# Patient Record
Sex: Female | Born: 1970 | Race: Asian | Hispanic: No | Marital: Married | Smoking: Current every day smoker
Health system: Southern US, Community
[De-identification: ages and names within clinical notes are randomized; demographics above are authoritative.]

## PROBLEM LIST (undated history)

## (undated) DIAGNOSIS — I10 Essential (primary) hypertension: Secondary | ICD-10-CM

## (undated) HISTORY — PX: TUBAL LIGATION: SHX77

---

## 1998-01-22 ENCOUNTER — Other Ambulatory Visit: Admission: RE | Admit: 1998-01-22 | Discharge: 1998-01-22 | Payer: Self-pay | Admitting: Obstetrics & Gynecology

## 2002-06-02 ENCOUNTER — Other Ambulatory Visit: Admission: RE | Admit: 2002-06-02 | Discharge: 2002-06-02 | Payer: Self-pay | Admitting: Obstetrics and Gynecology

## 2002-10-06 ENCOUNTER — Inpatient Hospital Stay (HOSPITAL_COMMUNITY): Admission: AD | Admit: 2002-10-06 | Discharge: 2002-10-06 | Payer: Self-pay | Admitting: Obstetrics and Gynecology

## 2002-10-20 ENCOUNTER — Inpatient Hospital Stay (HOSPITAL_COMMUNITY): Admission: AD | Admit: 2002-10-20 | Discharge: 2002-10-20 | Payer: Self-pay | Admitting: Obstetrics and Gynecology

## 2004-07-08 ENCOUNTER — Other Ambulatory Visit: Admission: RE | Admit: 2004-07-08 | Discharge: 2004-07-08 | Payer: Self-pay | Admitting: Obstetrics & Gynecology

## 2005-07-15 ENCOUNTER — Other Ambulatory Visit: Admission: RE | Admit: 2005-07-15 | Discharge: 2005-07-15 | Payer: Self-pay | Admitting: Obstetrics & Gynecology

## 2006-08-24 ENCOUNTER — Emergency Department (HOSPITAL_COMMUNITY): Admission: EM | Admit: 2006-08-24 | Discharge: 2006-08-24 | Payer: Self-pay | Admitting: Family Medicine

## 2010-07-18 ENCOUNTER — Ambulatory Visit (HOSPITAL_COMMUNITY): Admission: RE | Admit: 2010-07-18 | Discharge: 2010-07-18 | Payer: Self-pay | Admitting: Specialist

## 2012-07-10 ENCOUNTER — Ambulatory Visit: Payer: Self-pay | Admitting: Family Medicine

## 2012-07-10 VITALS — BP 152/90 | HR 82 | Temp 98.6°F | Resp 16 | Ht 63.0 in | Wt 127.0 lb

## 2012-07-10 DIAGNOSIS — Z720 Tobacco use: Secondary | ICD-10-CM

## 2012-07-10 DIAGNOSIS — F17200 Nicotine dependence, unspecified, uncomplicated: Secondary | ICD-10-CM

## 2012-07-10 MED ORDER — BUPROPION HCL ER (SR) 150 MG PO TB12: ORAL_TABLET | ORAL | Status: DC

## 2012-07-10 NOTE — Patient Instructions (Signed)
Start wellbutrin (zyban) - once per day for 3 days, then twice per day.  Stop smoking 5-7 days after starting medicine.  Return to the clinic or go to the nearest emergency room if any of your symptoms worsen or new symptoms occur.

## 2012-07-10 NOTE — Progress Notes (Signed)
  Subjective:    Patient ID: Vicki Jordan, female    DOB: Apr 30, 1971, 41 y.o.   MRN: [id number]  HPI Vicki Jordan is a 40 y.o. female  Here to discuss options for stopping smoking.  1/2 ppd smoker currently. Quit once in 2001 - restarted in 2008, 1.2ppd smoker since then.    Had been seen here in 2011 - rx given for medicine - but did not use it. No hx of depression/anxiety/SI or other mental illness. Had good relief in 2001 when she took zyban  - would like to use this again.   Review of Systems     Objective:   Physical Exam  Constitutional: She is oriented to person, place, and time. She appears well-developed and well-nourished.  HENT:  Head: Normocephalic and atraumatic.  Cardiovascular: Normal rate, regular rhythm, normal heart sounds and intact distal pulses.   Pulmonary/Chest: Effort normal and breath sounds normal.  Neurological: She is alert and oriented to person, place, and time.  Skin: Skin is warm and dry.  Psychiatric: She has a normal mood and affect. Her behavior is normal.       Assessment & Plan:  Vicki Jordan is a 41 y.o. female 1. Tobacco abuse  buPROPion (WELLBUTRIN SR) 150 MG 12 hr tablet   Options discussed.  Can retry zyban.  Consider chantix if not effective.  Patient Instructions  Start wellbutrin (zyban) - once per day for 3 days, then twice per day.  Stop smoking 5-7 days after starting medicine.  Return to the clinic or go to the nearest emergency room if any of your symptoms worsen or new symptoms occur.

## 2012-07-16 ENCOUNTER — Ambulatory Visit: Payer: Self-pay | Admitting: Family Medicine

## 2012-07-16 ENCOUNTER — Emergency Department (HOSPITAL_COMMUNITY): Payer: Self-pay

## 2012-07-16 ENCOUNTER — Encounter (HOSPITAL_COMMUNITY): Payer: Self-pay | Admitting: Emergency Medicine

## 2012-07-16 ENCOUNTER — Emergency Department (HOSPITAL_COMMUNITY)
Admission: EM | Admit: 2012-07-16 | Discharge: 2012-07-16 | Disposition: A | Discharge status: Home / Self Care | Payer: Self-pay | Attending: Emergency Medicine | Admitting: Emergency Medicine

## 2012-07-16 VITALS — BP 182/110 | HR 97 | Temp 98.2°F | Resp 16 | Ht 61.25 in | Wt 124.8 lb

## 2012-07-16 DIAGNOSIS — R51 Headache: Secondary | ICD-10-CM | POA: Insufficient documentation

## 2012-07-16 DIAGNOSIS — R209 Unspecified disturbances of skin sensation: Secondary | ICD-10-CM

## 2012-07-16 DIAGNOSIS — I10 Essential (primary) hypertension: Secondary | ICD-10-CM

## 2012-07-16 DIAGNOSIS — R29898 Other symptoms and signs involving the musculoskeletal system: Secondary | ICD-10-CM

## 2012-07-16 DIAGNOSIS — D649 Anemia, unspecified: Secondary | ICD-10-CM

## 2012-07-16 DIAGNOSIS — R202 Paresthesia of skin: Secondary | ICD-10-CM

## 2012-07-16 DIAGNOSIS — H538 Other visual disturbances: Secondary | ICD-10-CM | POA: Insufficient documentation

## 2012-07-16 DIAGNOSIS — A599 Trichomoniasis, unspecified: Secondary | ICD-10-CM | POA: Insufficient documentation

## 2012-07-16 HISTORY — DX: Essential (primary) hypertension: I10

## 2012-07-16 LAB — CBC WITH DIFFERENTIAL/PLATELET
Basophils Relative: 2 % — ABNORMAL HIGH (ref 0–1)
Eosinophils Absolute: 0 10*3/uL (ref 0.0–0.7)
HCT: 31.5 % — ABNORMAL LOW (ref 36.0–46.0)
Hemoglobin: 9.1 g/dL — ABNORMAL LOW (ref 12.0–15.0)
MCH: 17.8 pg — ABNORMAL LOW (ref 26.0–34.0)
MCHC: 28.9 g/dL — ABNORMAL LOW (ref 30.0–36.0)
MCV: 61.8 fL — ABNORMAL LOW (ref 78.0–100.0)
Monocytes Absolute: 0.3 10*3/uL (ref 0.1–1.0)
Monocytes Relative: 6 % (ref 3–12)

## 2012-07-16 LAB — COMPREHENSIVE METABOLIC PANEL
Albumin: 3.9 g/dL (ref 3.5–5.2)
BUN: 8 mg/dL (ref 6–23)
Chloride: 98 mEq/L (ref 96–112)
Creatinine, Ser: 0.65 mg/dL (ref 0.50–1.10)
Total Bilirubin: 0.9 mg/dL (ref 0.3–1.2)
Total Protein: 7.4 g/dL (ref 6.0–8.3)

## 2012-07-16 LAB — POCT CBC
HCT, POC: 33.6 % — AB (ref 37.7–47.9)
Hemoglobin: 9.6 g/dL — AB (ref 12.2–16.2)
Lymph, poc: 1.7 (ref 0.6–3.4)
MCH, POC: 17.7 pg — AB (ref 27–31.2)
MCHC: 28.6 g/dL — AB (ref 31.8–35.4)
WBC: 6.3 10*3/uL (ref 4.6–10.2)

## 2012-07-16 LAB — URINE MICROSCOPIC-ADD ON

## 2012-07-16 LAB — URINALYSIS, ROUTINE W REFLEX MICROSCOPIC
Glucose, UA: NEGATIVE mg/dL
Protein, ur: NEGATIVE mg/dL

## 2012-07-16 LAB — POCT PREGNANCY, URINE: Preg Test, Ur: NEGATIVE

## 2012-07-16 MED ORDER — DOXYCYCLINE HYCLATE 100 MG PO CAPS: 100.0000 mg | ORAL_CAPSULE | Freq: Two times a day (BID) | ORAL | Status: DC

## 2012-07-16 MED ORDER — AZITHROMYCIN 250 MG PO TABS
1000.0000 mg | ORAL_TABLET | Freq: Once | ORAL | Status: AC
  Administered 2012-07-16: 1000 mg via ORAL
  Filled 2012-07-16: qty 4

## 2012-07-16 NOTE — ED Notes (Signed)
Pt describes episode of blurred vision last noc around 2000, left arm weakness and numbness onset yesterday morning. Checked BP this a.m. And went to urgent care. Sent here from urgent care, BP there 182/110

## 2012-07-16 NOTE — ED Notes (Signed)
I placed her personal belongings into a bag along with her purse, shoes, jeans and a bra

## 2012-07-16 NOTE — Patient Instructions (Addendum)
Your blood pressure is very high today, and even though no weakness was noted on my exam, your arm numbness and weakness since yesterday, along with this high blood pressure needs to be evaluated in the emergency room.  Your daughter should drive you to [facility] ER now.  I have advised their staff that you are on the way.  Your blood count was low today (anemia) but only a little lower than in the past.  Recheck in the next 2 weeks to discuss causes for this and  workup for treatment. Return to the clinic or go to the nearest emergency room if any of your symptoms worsen or new symptoms occur.

## 2012-07-16 NOTE — ED Provider Notes (Signed)
History     CSN: [id number]  Arrival date & time 07/16/12  1320   First MD Initiated Contact with Patient 07/16/12 1503      Chief Complaint  Patient presents with  . Blurred Vision  . Numbness    HPI  The patient presents with concerns of headache, hypertension, visual changes.  She was previously on blood pressure medication, but has not taken an anion long time.  She notes approximately one week ago, while at urgent care for an unrelated complaint she described headache, her blood pressure checked, found to be elevated.  She notes that since that time she has had headache, diffuse, throbbing.  She received a single dose of antihypertensive at that time, and her blood pressure responded appropriately. Yesterday the patient had multiple episodes of visual loss transiently while ambulatory.  She denies any precipitant other than walking.  Symptoms resolved spontaneously, after moments.  She notes no concurrent increase in her headache. Today she has no current visual complaints, but continues to complain of headache. The patient also notes left arm pain, though this is focally in the areolar she has been taking her blood pressure multiple times, the left a.c. area.  The pain is sharp, occurring after innumerable blood pressure measurements taken today.  Past Medical History  Diagnosis Date  . Hypertension     Past Surgical History  Procedure Date  . Tubal ligation     No family history on file.  History  Substance Use Topics  . Smoking status: Current Every Day Smoker -- .5 years    Types: Cigarettes  . Smokeless tobacco: Never Used  . Alcohol Use: No    OB History    Grav Para Term Preterm Abortions TAB SAB Ect Mult Living                  Review of Systems  Constitutional:       HPI  HENT:       HPI otherwise negative  Eyes: Negative.   Respiratory:       HPI, otherwise negative  Cardiovascular:       HPI, otherwise nmegative  Gastrointestinal: Negative for  vomiting.  Genitourinary:       HPI, otherwise negative  Musculoskeletal:       HPI, otherwise negative  Skin: Negative.   Neurological: Positive for headaches. Negative for dizziness, seizures, syncope, facial asymmetry, weakness, light-headedness and numbness.    Allergies  Review of patient's allergies indicates no known allergies.  Home Medications   Current Outpatient Rx  Name Route Sig Dispense Refill  . BUPROPION HCL ER (SR) 150 MG PO TB12 Oral Take 150 mg by mouth 2 (two) times daily.    Doreatha Martin COLD & FLU PO Oral Take 5 mLs by mouth 2 (two) times daily as needed. For cold symptoms    . NAPROXEN SODIUM 220 MG PO TABS Oral Take 440 mg by mouth 2 (two) times daily as needed. For headache pain      BP 133/82  Pulse 69  Temp 98.2 F (36.8 C) (Oral)  Resp 25  SpO2 99%  LMP 07/09/2012  Physical Exam  Nursing note and vitals reviewed. Constitutional: She is oriented to person, place, and time. She appears well-developed and well-nourished. No distress.  HENT:  Head: Normocephalic and atraumatic.  Eyes: Conjunctivae normal and EOM are normal. Pupils are equal, round, and reactive to light.  Cardiovascular: Normal rate and regular rhythm.   Pulmonary/Chest: Effort normal and breath  sounds normal. No stridor. No respiratory distress.  Abdominal: She exhibits no distension.  Musculoskeletal: She exhibits no edema.  Neurological: She is alert and oriented to person, place, and time. No cranial nerve deficit. She exhibits normal muscle tone. Coordination normal.  Skin: Skin is warm and dry.  Psychiatric: She has a normal mood and affect.    ED Course  Procedures (including critical care time)   Labs Reviewed  CBC WITH DIFFERENTIAL  COMPREHENSIVE METABOLIC PANEL  URINALYSIS, ROUTINE W REFLEX MICROSCOPIC   Dg Chest 2 View  07/16/2012  *RADIOLOGY REPORT*  Clinical Data:  Discomfort, finger tingling, blurred vision  CHEST - 2 VIEW  Comparison: None.  Findings: Lungs are  well-aerated.  Negative for pulmonary edema, or focal airspace consolidation.  Diffuse mild prominence of the interstitial markings.  No prior imaging for comparison.  Cardiac and mediastinal contours within normal limits for size. No acute osseous finding.  IMPRESSION:  1.  No acute cardiopulmonary disease. 2.  Mild diffuse coarsening of the interstitial markings is likely chronic and related to the patient's smoking history.   Original Report Authenticated By: Threasa Beards Head Wo Contrast  07/16/2012  *RADIOLOGY REPORT*  Clinical Data: Headache for 5-6 days.  CT HEAD WITHOUT CONTRAST  Technique:  Contiguous axial images were obtained from the base of the skull through the vertex without contrast.  Comparison: None.  Findings: The brain appears normal without evidence of infarct, hemorrhage, mass lesion, mass effect, midline shift or abnormal extra-axial fluid collection.  No hydrocephalus or pneumocephalus. Calvarium intact.  Imaged paranasal sinuses and mastoid air cells are clear.  IMPRESSION: Normal study.   Original Report Authenticated By: Bernadene Bell. Maricela Curet, M.D.      No diagnosis found.  Cardiac: 70sr, normal O2: 99%ra, normal   Date: 07/16/2012  Rate: 69  Rhythm: normal sinus rhythm  QRS Axis: normal  Intervals: normal  ST/T Wave abnormalities: normal  Conduction Disutrbances: none  Narrative Interpretation: unremarkable (much artefact)    UPDATE: patient remains calm  MDM  This young female presents with concerns of hypertension, intermittent visual issues, and an episode of headache.  On exam she is in no distress with no focal neurologic.  The patient is hypertensive on presentation her blood pressure improves spontaneously.  Labs are reassuring, though the patient's trichomonas.  No evidence of endorgan effects.  The patient ABCDs to score is 1.  We discussed the need for close outpatient followup.  The patient was started on antibiotics for her infection, discharged in  stable condition.  Gerhard Munch, MD 07/16/12 1715

## 2012-07-16 NOTE — Progress Notes (Signed)
Subjective:    Patient ID: Vicki Jordan, female    DOB: [birth year], 41 y.o.   MRN: [id number]  HPI Vicki Jordan is a 41 y.o. female  Having headache for past 5-6 days.  Hx of high blood pressure meds few years ago - unknown name. Usually has ha and blurry vision if pressure runs high.  No current blurry vision.  No chest pain.  both arms feel weak, and some numbness  into fingers.  No prior similar symptoms.  No hx of stroke or TIA, No hx of MI. No leg weakness, no slurred speech or facial weakness. Headache all over. No dizziness/lightheadeness, nausea, or dyspnea.  Both hands with numbness into fingertips.  Tobacco abuse - had been trying to quit this week, on wellbutrin until this morning - had headache - so stopped wellbutrin 2 days ago.  No illicit drug use.  Did take nyquil and alleve yesterday.    Review of Systems  Constitutional: Negative for fever and chills.  Respiratory: Negative for chest tightness and shortness of breath.   Cardiovascular: Negative for chest pain and palpitations.  Neurological: Positive for weakness, numbness (hands.left greater than right. ) and headaches. Negative for dizziness and light-headedness.   As above.     Objective:   Physical Exam  Constitutional: She is oriented to person, place, and time. She appears well-developed and well-nourished.  HENT:  Head: Normocephalic and atraumatic.  Eyes: Conjunctivae normal and EOM are normal. Pupils are equal, round, and reactive to light.  Neck: Carotid bruit is not present.  Cardiovascular: Normal rate, regular rhythm, normal heart sounds and intact distal pulses.   Pulmonary/Chest: Effort normal and breath sounds normal.  Abdominal: Soft. She exhibits no pulsatile midline mass. There is no tenderness.  Musculoskeletal:       Right elbow: Normal.      Left elbow: Normal.       Right wrist: Normal.       Left wrist: Normal.       Right hand: Normal strength noted.       Left hand: Normal strength  noted.  Neurological: She is alert and oriented to person, place, and time.  Skin: Skin is warm and dry.  Psychiatric: She has a normal mood and affect. Her behavior is normal.    EKG: sr, no apparent acute findings.  Results for orders placed in visit on 07/16/12  POCT CBC      Component Value Range   WBC 6.3  4.6 - 10.2 K/uL   Lymph, poc 1.7  0.6 - 3.4   POC LYMPH PERCENT 27.4  10 - 50 %L   MID (cbc) 0.4  0 - 0.9   POC MID % 6.0  0 - 12 %M   POC Granulocyte 4.2  2 - 6.9   Granulocyte percent 66.6  37 - 80 %G   RBC 5.43  4.04 - 5.48 M/uL   Hemoglobin 9.6 (*) 12.2 - 16.2 g/dL   HCT, POC 40.9 (*) 81.1 - 47.9 %   MCV 61.9 (*) 80 - 97 fL   MCH, POC 17.7 (*) 27 - 31.2 pg   MCHC 28.6 (*) 31.8 - 35.4 g/dL   RDW, POC 91.4     Platelet Count, POC 487 (*) 142 - 424 K/uL   MPV 0  0 - 99.8 fL  GLUCOSE, POCT (MANUAL RESULT ENTRY)      Component Value Range   POC Glucose 98  70 - 99 mg/dl   7829: less  numbness in arms. Feels better, but still there.  Still c/o headache.   BP recheck noted a 178/120.  Manual BP R arm: 190/110, L arm 188/102.   Clonidine 0.1mg  po at 1120. Recheck BP at 1128: 198/116 L arm, 190/112 on R arm.  BP168/102 R arm at 1145.       Assessment & Plan:  Vicki Jordan is a 41 y.o. female 1. Headache  POCT CBC, POCT glucose (manual entry), EKG 12-Lead  2. HTN (hypertension)  POCT CBC, POCT glucose (manual entry), EKG 12-Lead  3. Paresthesia of hand  POCT CBC, POCT glucose (manual entry), EKG 12-Lead  4. Weakness of both arms    5. Anemia     HTN - uncontrolled, with hypertensive urgency - subjective symptoms of left greater than R arm weakness and dysethesias/paprasthesias since yesterday.  No focal weakness on exam noted, and slight improvement in symptoms in office, but dysesthesias persistent.Minimal change with clonidine 0.1mg . Given in office.  Will have evaluated in ER - daughter to drive patient there today. 911 precautions discussed as private vehicle  transport. ER charge nurse advised at [facility].   Anemia - HGB 9.7.  Last recorded hgb here 10.5 in may 2005.  Recommended follow up after hospital eval for HTN.   Tobacco abuse - unlikely Wellbutrin as cause of headache, but can discuss cessation options at follow up after HTN evaluated/treated -   Patient Instructions  Your blood pressure is very high today, and even though no weakness was noted on my exam, your arm numbness and weakness since yesterday, along with this high blood pressure needs to be evaluated in the emergency room.  Your daughter should drive you to [facility] ER now.  I have advised their staff that you are on the way.  Your blood count was low today (anemia) but only a little lower than in the past.  Recheck in the next 2 weeks to discuss causes for this and  workup for treatment. Return to the clinic or go to the nearest emergency room if any of your symptoms worsen or new symptoms occur.

## 2012-07-17 ENCOUNTER — Ambulatory Visit: Payer: Self-pay | Admitting: Family Medicine

## 2012-07-17 VITALS — BP 148/92 | HR 92 | Temp 98.1°F | Resp 16 | Ht 61.25 in | Wt 125.0 lb

## 2012-07-17 DIAGNOSIS — R8281 Pyuria: Secondary | ICD-10-CM

## 2012-07-17 DIAGNOSIS — D649 Anemia, unspecified: Secondary | ICD-10-CM

## 2012-07-17 DIAGNOSIS — R82998 Other abnormal findings in urine: Secondary | ICD-10-CM

## 2012-07-17 DIAGNOSIS — I10 Essential (primary) hypertension: Secondary | ICD-10-CM

## 2012-07-17 DIAGNOSIS — A599 Trichomoniasis, unspecified: Secondary | ICD-10-CM

## 2012-07-17 LAB — POCT CBC
Granulocyte percent: 65.2 %G (ref 37–80)
MCHC: 28.4 g/dL — AB (ref 31.8–35.4)
MID (cbc): 0.4 (ref 0–0.9)
POC Granulocyte: 4.3 (ref 2–6.9)
POC LYMPH PERCENT: 28.3 %L (ref 10–50)
Platelet Count, POC: 491 10*3/uL — AB (ref 142–424)
RDW, POC: 20.9 %

## 2012-07-17 LAB — POCT URINALYSIS DIPSTICK
Glucose, UA: NEGATIVE
Spec Grav, UA: 1.015

## 2012-07-17 LAB — IFOBT (OCCULT BLOOD): IFOBT: NEGATIVE

## 2012-07-17 LAB — POCT UA - MICROSCOPIC ONLY: Mucus, UA: NEGATIVE

## 2012-07-17 MED ORDER — METRONIDAZOLE 500 MG PO TABS: ORAL_TABLET | ORAL | Status: DC

## 2012-07-17 MED ORDER — HYDROCHLOROTHIAZIDE 12.5 MG PO TABS: 12.5000 mg | ORAL_TABLET | Freq: Every day | ORAL | Status: DC

## 2012-07-17 MED ORDER — FERROUS SULFATE 325 (65 FE) MG PO TABS: 325.0000 mg | ORAL_TABLET | Freq: Every day | ORAL | Status: DC

## 2012-07-17 NOTE — Progress Notes (Signed)
Subjective:    Patient ID: Vicki Jordan, female    DOB: 1971/08/18, 41 y.o.   MRN: [id number]  HPI Vicki Jordan is a 41 y.o. female  See OV yesterday.  Sent to ER due to high blood pressure.  Monitored there,  Had cxr and ct scan - no acute findings.  Feeling better today.  Still tired and some headache - minimal. Not started on any blood pressure medicine in the ER. No arm weakness - just sore from bp cuff yesterday. Home bp this am: 140/88. Most recent rx hctz in 2011.  Lisinopril in past.   In ER had urine test - 21-50 WBC, but many epithelial cells,  and trichomonas noted. No dysuria, frequency, urgency, or abd pain.  Has had some vaginal discharge - longstanding. Last sexually active 3 years ago. Had azithromycin 1000g po x 1 yesterday, then rx for doxycycline - but has not had filled.  Anemia - HGB 9.7 Yesterday in office, 9.1 in the ER .  Last recorded hgb here 10.5 in may 2005. Menses 4-5 days.  Heavy bleeding, but this is chronic - no recent change. LMP - last week, normal.  No blood in stools, or dark/tarry stools.   Review of Systems  Constitutional: Negative for fever and chills.  Respiratory: Negative for cough and chest tightness.   Cardiovascular: Negative for chest pain.  Gastrointestinal: Negative for abdominal pain.  Genitourinary: Positive for vaginal discharge. Negative for dysuria, urgency, frequency, hematuria, vaginal bleeding and vaginal pain.       Objective:   Physical Exam  Constitutional: She is oriented to person, place, and time. She appears well-developed and well-nourished.  HENT:  Head: Normocephalic and atraumatic.  Eyes: Conjunctivae normal and EOM are normal. Pupils are equal, round, and reactive to light.  Neck: Carotid bruit is not present.  Cardiovascular: Normal rate, regular rhythm, normal heart sounds and intact distal pulses.   Pulmonary/Chest: Effort normal and breath sounds normal.  Abdominal: Soft. She exhibits no pulsatile midline mass.  There is no tenderness.  Genitourinary:       hemosure obtained - exam performed by Dr. Nilda Simmer.  No apparent mass. Small skin tag.     Neurological: She is alert and oriented to person, place, and time.  Skin: Skin is warm and dry.  Psychiatric: She has a normal mood and affect. Her behavior is normal.   hemosure obtained - exam performed by Dr. Nilda Simmer.  No apparent mass. Small skin tag.   Results for orders placed in visit on 07/17/12  POCT URINALYSIS DIPSTICK      Component Value Range   Color, UA yellow     Clarity, UA cloudy     Glucose, UA neg     Bilirubin, UA small     Ketones, UA 15mg      Spec Grav, UA 1.015     Blood, UA neg     pH, UA 7.0     Protein, UA 30mg      Urobilinogen, UA 1.0     Nitrite, UA neg     Leukocytes, UA large (3+)    POCT UA - MICROSCOPIC ONLY      Component Value Range   WBC, Ur, HPF, POC tntc     RBC, urine, microscopic 8-12     Bacteria, U Microscopic trace     Mucus, UA neg     Epithelial cells, urine per micros 5-7     Crystals, Ur, HPF, POC calcium oxalate  Casts, Ur, LPF, POC neg     Yeast, UA neg     Trichomonas, UA positive    POCT CBC      Component Value Range   WBC 6.6  4.6 - 10.2 K/uL   Lymph, poc 1.9  0.6 - 3.4   POC LYMPH PERCENT 28.3  10 - 50 %L   MID (cbc) 0.4  0 - 0.9   POC MID % 6.5  0 - 12 %M   POC Granulocyte 4.3  2 - 6.9   Granulocyte percent 65.2  37 - 80 %G   RBC 5.55 (*) 4.04 - 5.48 M/uL   Hemoglobin 9.9 (*) 12.2 - 16.2 g/dL   HCT, POC 16.1 (*) 09.6 - 47.9 %   MCV 62.9 (*) 80 - 97 fL   MCH, POC 17.8 (*) 27 - 31.2 pg   MCHC 28.4 (*) 31.8 - 35.4 g/dL   RDW, POC 04.5     Platelet Count, POC 491 (*) 142 - 424 K/uL   MPV    0 - 99.8 fL  IFOBT (OCCULT BLOOD)      Component Value Range   IFOBT Negative         Assessment & Plan:  Vicki Jordan is a 41 y.o. female 1. HTN (hypertension)    2. Anemia  POCT CBC, IFOBT POC (occult bld, rslt in office)  3. Pyuria  POCT urinalysis dipstick, POCT UA  - Microscopic Only, Urine culture  4. Trichomonosis     HTN - improved today, but prior HTN urgency.  Hctz 12.5mg  qd.  Check outside numbers and recheck in next 2 weeks.   Anemia -  Chronic.  hemosure negative. Hx of heavy menses. Start iron 325mg  qd and recheck levels in few weeks.  Trichomonas - flagyl 500mg  - 4x1. Sed.  Denies recent partners to be treated.   Pyuria - asx, with non-clean catch u/a yesterday. Still tntc wbc, but trichomonas also noted as above may be contributor.  Urine culture sent.   Borderline Na - 132 yesterday. Recheck in few weeks at recheck.   Patient Instructions  Start the new blood pressure medicine - once per day. Keep a record of your blood pressures outside of the office and bring them to the next office visit. Take all 4 metronidazole today for trichomonas infection - see the handout form the ER.  You do not need to take the medicine prescribed in the emergency room. You are anemic (low blood count) - start iron pill once per day. Recheck in 2 weeks - sooner if any worse. Your should receive a call or letter about your lab results within the next week to 10 days.  Return to the clinic or go to the nearest emergency room if any of your symptoms worsen or new symptoms occur.

## 2012-07-17 NOTE — Patient Instructions (Addendum)
Start the new blood pressure medicine - once per day. Keep a record of your blood pressures outside of the office and bring them to the next office visit. Take all 4 metronidazole today for trichomonas infection - see the handout form the ER.  You do not need to take the medicine prescribed in the emergency room. You are anemic (low blood count) - start iron pill once per day. Recheck in 2 weeks - sooner if any worse. Your should receive a call or letter about your lab results within the next week to 10 days.  Return to the clinic or go to the nearest emergency room if any of your symptoms worsen or new symptoms occur.

## 2012-07-19 LAB — URINE CULTURE: Colony Count: NO GROWTH

## 2012-09-17 ENCOUNTER — Ambulatory Visit: Payer: Self-pay | Admitting: Family Medicine

## 2012-09-17 VITALS — BP 158/93 | HR 72 | Temp 98.2°F | Resp 18 | Ht 61.25 in | Wt 133.0 lb

## 2012-09-17 DIAGNOSIS — I10 Essential (primary) hypertension: Secondary | ICD-10-CM

## 2012-09-17 MED ORDER — LISINOPRIL 10 MG PO TABS: 10.0000 mg | ORAL_TABLET | Freq: Every day | ORAL | Status: DC

## 2012-09-17 NOTE — Progress Notes (Signed)
@[email or web address] @  Patient ID: Vicki Jordan MRN: [id number], DOB: [birth year], 41 y.o. Date of Encounter: 09/17/2012, 6:18 PM  Primary Physician: Tally Due, MD  Chief Complaint: HTN  HPI: 41 y.o. year old female with history below presents for hypertension follow up.  Patient did well on Lisinopril but has not kept blood pressure controlled on HCTZ.  She has had a headache x 24 hours. She quit cigarettes one month ago She is exercising regularly/  No CP, visual changes, or focal deficits.   Past Medical History  Diagnosis Date  . Hypertension      Home Meds: Prior to Admission medications   Medication Sig Start Date End Date Taking? Authorizing Provider  ferrous sulfate 325 (65 FE) MG tablet Take 1 tablet (325 mg total) by mouth daily with breakfast. 07/17/12  Yes Shade Flood, MD  hydrochlorothiazide (HYDRODIURIL) 12.5 MG tablet Take 1 tablet (12.5 mg total) by mouth daily. 07/17/12  Yes Shade Flood, MD  buPROPion [facility] SR) 150 MG 12 hr tablet Take 150 mg by mouth 2 (two) times daily.    Historical Provider, MD  DM-Doxylamine-Acetaminophen (NYQUIL COLD & FLU PO) Take 5 mLs by mouth 2 (two) times daily as needed. For cold symptoms    Historical Provider, MD  metroNIDAZOLE (FLAGYL) 500 MG tablet 4 pills by mouth all at once. Avoid any alcohol while taking this medicine. 07/17/12   Shade Flood, MD  naproxen sodium (ALEVE) 220 MG tablet Take 440 mg by mouth 2 (two) times daily as needed. For headache pain    Historical Provider, MD    Allergies: No Known Allergies  History   Social History  . Marital Status: Single    Spouse Name: N/A    Number of Children: N/A  . Years of Education: N/A   Occupational History  . Not on file.   Social History Main Topics  . Smoking status: Former Smoker -- .5 years    Types: Cigarettes    Quit date: 08/13/2012  . Smokeless tobacco: Never Used  . Alcohol Use: No  . Drug Use: No  . Sexually Active: Not on file    Other Topics Concern  . Not on file   Social History Narrative  . No narrative on file     No family history on file.  Review of Systems: Constitutional: negative for chills, fever, night sweats, weight changes, or fatigue  HEENT: negative for vision changes, hearing loss, congestion, rhinorrhea, ST, epistaxis, or sinus pressure Cardiovascular: negative for chest pain, palpitations, or DOE Respiratory: negative for hemoptysis, wheezing, shortness of breath, or cough Abdominal: negative for abdominal pain, nausea, vomiting, diarrhea, or constipation Dermatological: negative for rash Neurologic: negative for headache, dizziness, or syncope All other systems reviewed and are otherwise negative with the exception to those above and in the HPI.   Physical Exam: Blood pressure 158/93, pulse 72, temperature 98.2 F (36.8 C), temperature source Oral, resp. rate 18, height 5' 1.25" (1.556 m), weight 133 lb (60.328 kg), last menstrual period 09/07/2012, SpO2 100.00%., Body mass index is 24.93 kg/(m^2). General: Well developed, well nourished, in no acute distress. Head: Normocephalic, atraumatic, eyes without discharge, sclera non-icteric, nares are without discharge. Bilateral auditory canals clear, TM's are without perforation, pearly grey and translucent with reflective cone of light bilaterally. Oral cavity moist, posterior pharynx without exudate, erythema, peritonsillar abscess, or post nasal drip.  Neck: Supple. No thyromegaly. Full ROM. No lymphadenopathy. No carotid bruits. Lungs: Clear bilaterally to auscultation without wheezes, rales,  or rhonchi. Breathing is unlabored. Heart: RRR with S1 S2. No murmurs, rubs, or gallops appreciated.  Abdomen: Soft, non-tender, non-distended with normoactive bowel sounds. No hepatosplenomegaly. No rebound/guarding. No obvious abdominal masses. Msk:  Strength and tone normal for age. Extremities/Skin: Warm and dry. No clubbing or cyanosis. No  edema. No rashes or suspicious lesions. Distal pulses 2+ and equal bilaterally. Neuro: Alert and oriented X 3. Moves all extremities spontaneously. Gait is normal. CNII-XII grossly in tact. DTR 2+, cerebellar function intact. Rhomberg normal. Psych:  Responds to questions appropriately with a normal affect.     ASSESSMENT AND PLAN:  41 y.o. year old female with hypertension, poorly controlled with hctz. 1. Hypertension  lisinopril (PRINIVIL,ZESTRIL) 10 MG tablet    -  Signed, Elvina Sidle, MD 09/17/2012 6:18 PM

## 2012-09-17 NOTE — Patient Instructions (Signed)

## 2012-10-08 IMAGING — CT CT HEAD W/O CM
2 series · 16 of 30 positions shown, 20 images · non-contrast
Comparison: None.

CLINICAL DATA: Headache for 5-6 days.

CT HEAD WITHOUT CONTRAST
TECHNIQUE: Contiguous axial images were obtained from the base of
the skull through the vertex without contrast.

[Series 2: head w/o · axial · non-contrast · 0.43mm/px · z∈[-115,+5]mm · 13 of 29 slices shown, 17 images]
[im 3/29  brain]
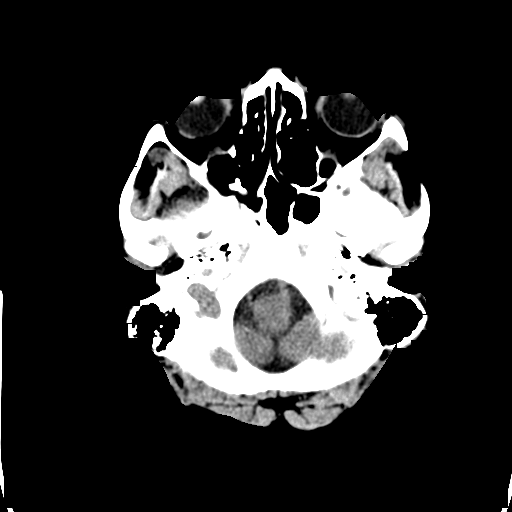
[im 3/29  bone]
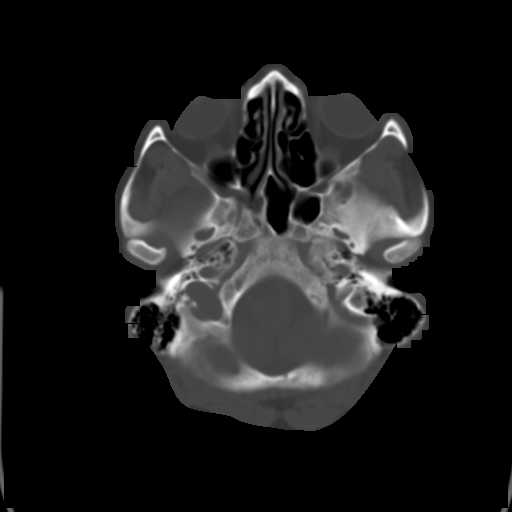
[im 5/29  brain]
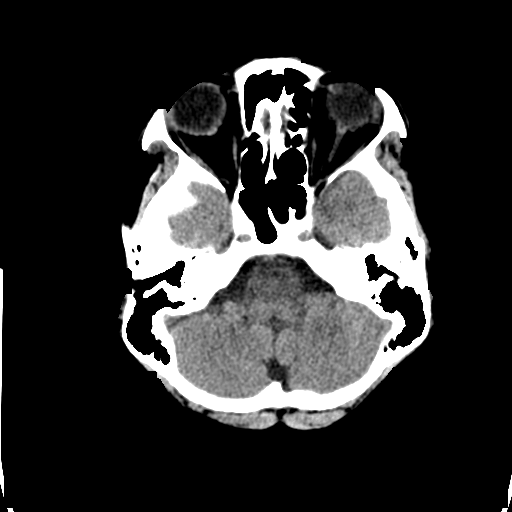
[im 7/29  brain]
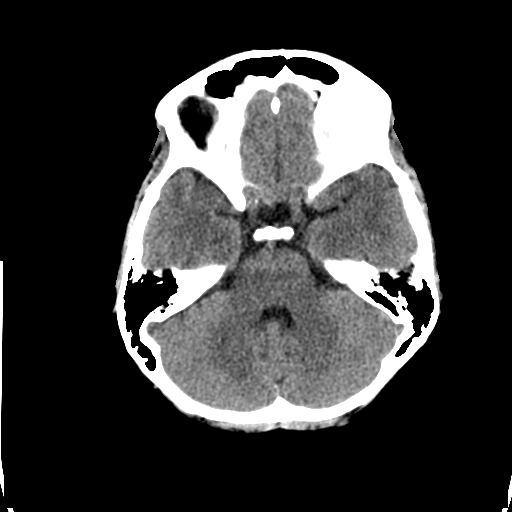
[im 9/29  brain]
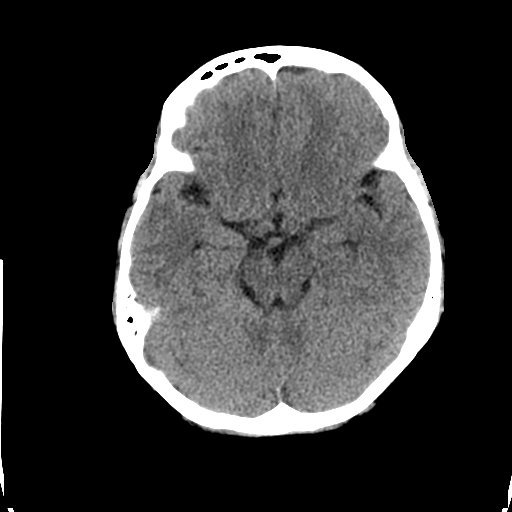
[im 11/29  brain]
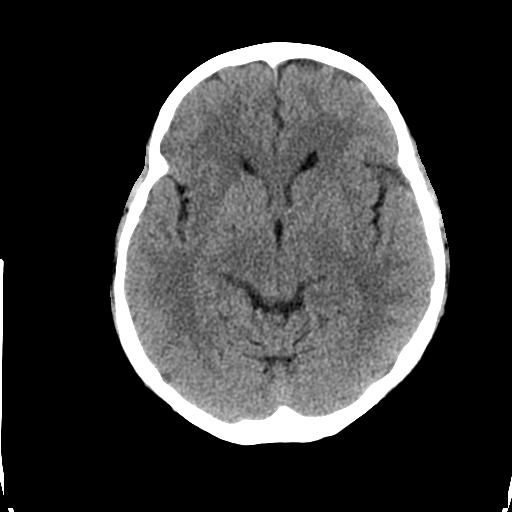
[im 11/29  bone]
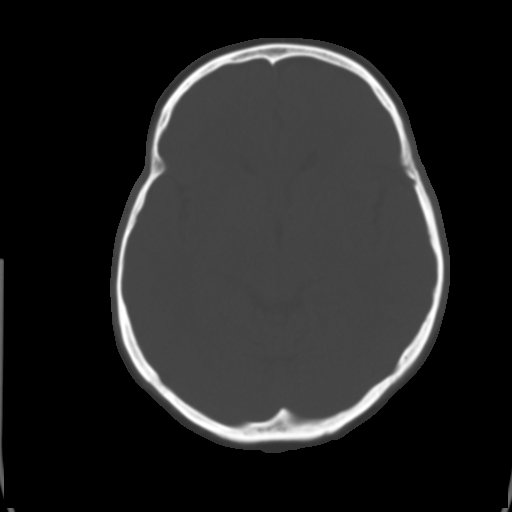
[im 13/29  brain]
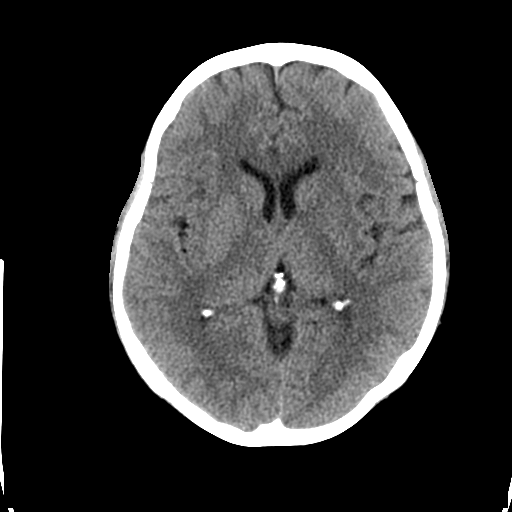
[im 15/29  brain]
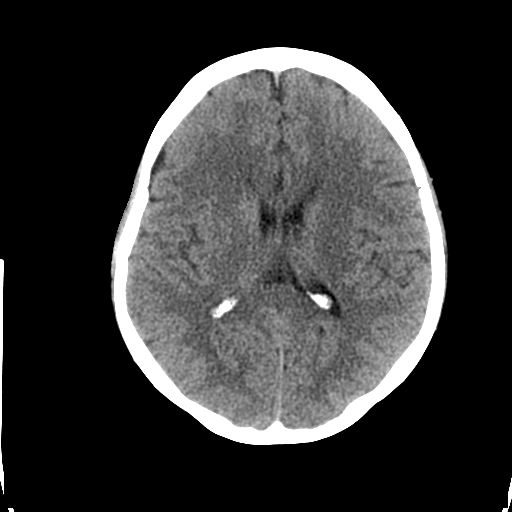
[im 17/29  brain]
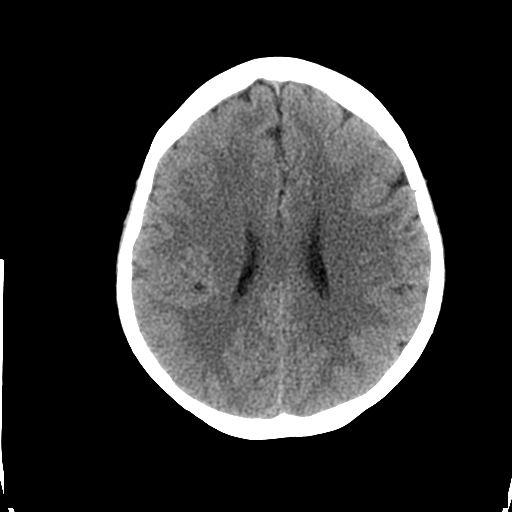
[im 19/29  brain]
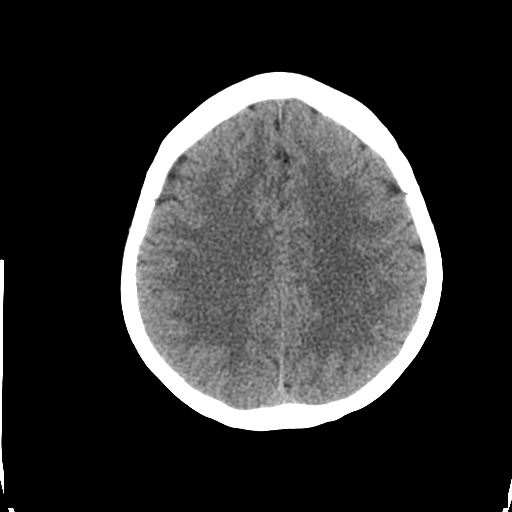
[im 19/29  bone]
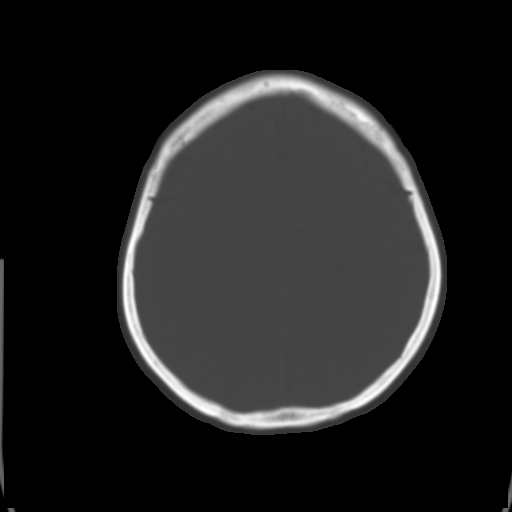
[im 21/29  brain]
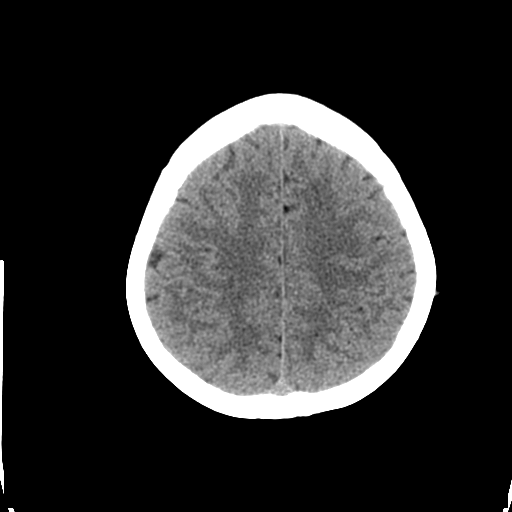
[im 23/29  brain]
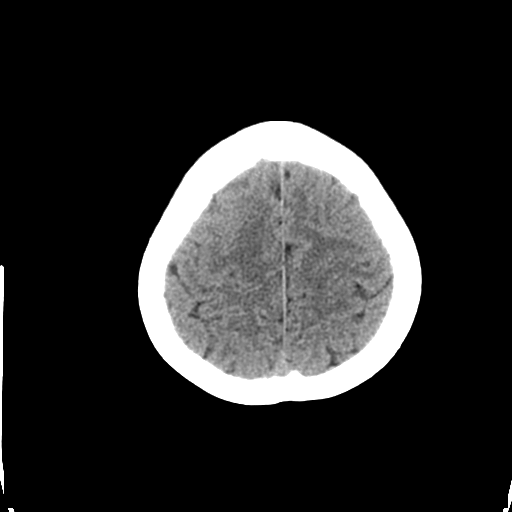
[im 25/29  brain]
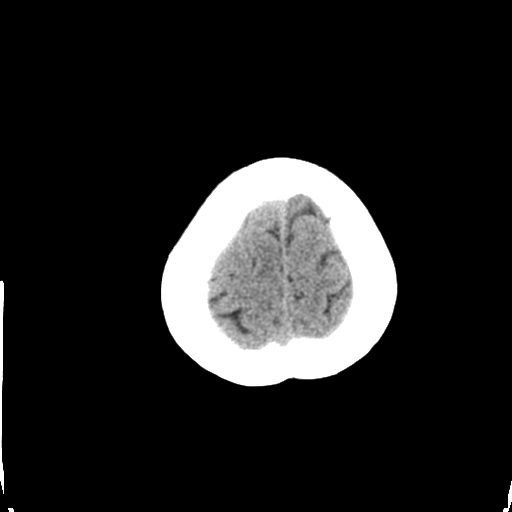
[im 27/29  brain]
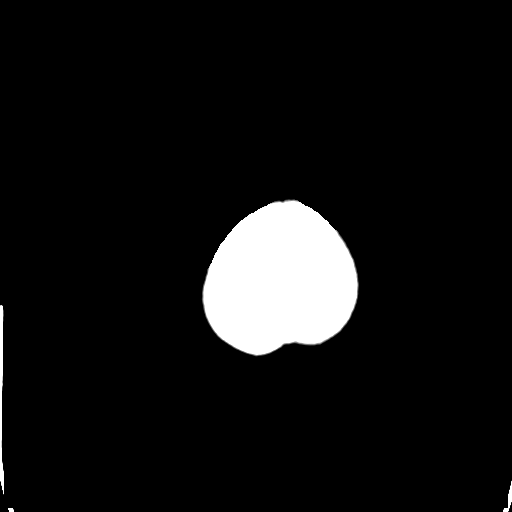
[im 27/29  bone]
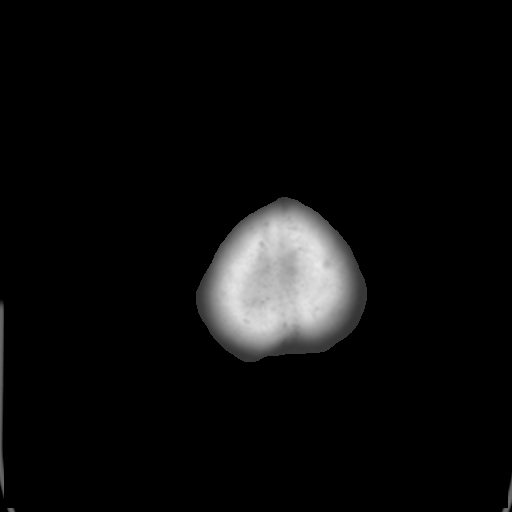

[Series 3: bone windows · axial · 0.43mm/px · z∈[-115,-75]mm · 3 of 29 slices shown]
[im 3/29  bone]
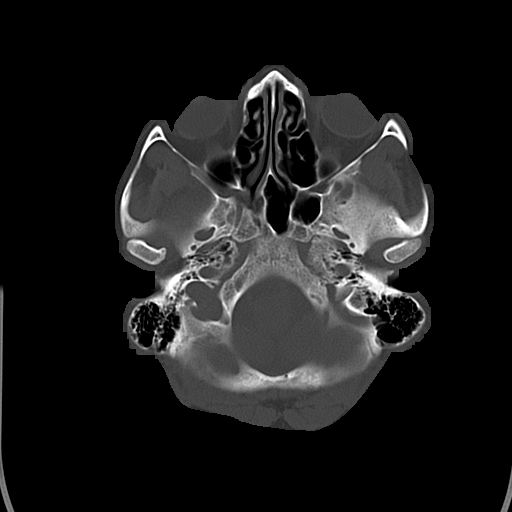
[im 7/29  bone]
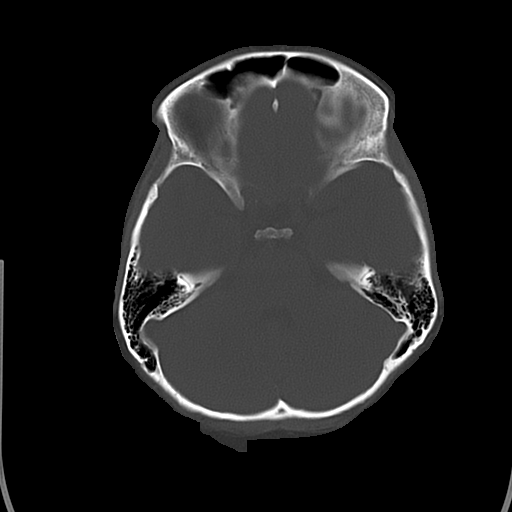
[im 11/29  bone]
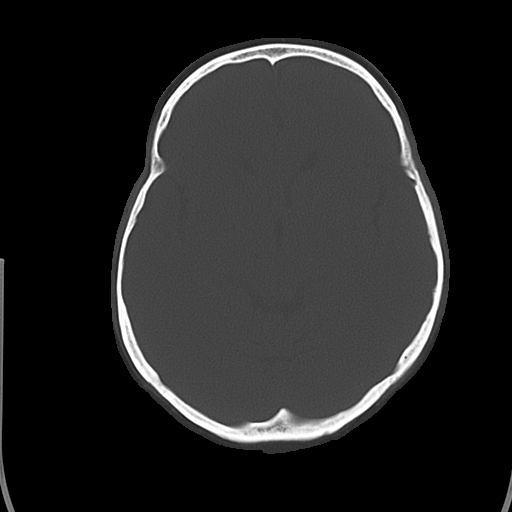

[16 of 30 positions shown; findings below may reference images not displayed]

FINDINGS: The brain appears normal without evidence of infarct,
hemorrhage, mass lesion, mass effect, midline shift or abnormal
extra-axial fluid collection.  No hydrocephalus or pneumocephalus.
Calvarium intact.  Imaged paranasal sinuses and mastoid air cells
are clear.
IMPRESSION: Normal study.

## 2013-12-08 ENCOUNTER — Other Ambulatory Visit: Payer: Self-pay | Admitting: Family Medicine

## 2013-12-13 ENCOUNTER — Other Ambulatory Visit: Payer: Self-pay | Admitting: Family Medicine

## 2014-02-04 ENCOUNTER — Encounter: Payer: Self-pay | Admitting: Family Medicine

## 2014-02-04 ENCOUNTER — Ambulatory Visit (INDEPENDENT_AMBULATORY_CARE_PROVIDER_SITE_OTHER): Payer: 59 | Admitting: Family Medicine

## 2014-02-04 VITALS — BP 120/82 | HR 84 | Temp 98.7°F | Resp 18 | Ht 62.0 in | Wt 124.0 lb

## 2014-02-04 DIAGNOSIS — Z7184 Encounter for health counseling related to travel: Secondary | ICD-10-CM

## 2014-02-04 DIAGNOSIS — D649 Anemia, unspecified: Secondary | ICD-10-CM

## 2014-02-04 DIAGNOSIS — Z7189 Other specified counseling: Secondary | ICD-10-CM

## 2014-02-04 DIAGNOSIS — I10 Essential (primary) hypertension: Secondary | ICD-10-CM

## 2014-02-04 DIAGNOSIS — G4700 Insomnia, unspecified: Secondary | ICD-10-CM

## 2014-02-04 LAB — POCT CBC
Granulocyte percent: 59.7 %G (ref 37–80)
HCT, POC: 46.5 % (ref 37.7–47.9)
Hemoglobin: 15 g/dL (ref 12.2–16.2)
Lymph, poc: 2.4 (ref 0.6–3.4)
MCH, POC: 29.6 pg (ref 27–31.2)
MCHC: 32.3 g/dL (ref 31.8–35.4)
MCV: 91.8 fL (ref 80–97)
MID (cbc): 0.4 (ref 0–0.9)
MPV: 9.2 fL (ref 0–99.8)
POC Granulocyte: 4.1 (ref 2–6.9)
POC LYMPH PERCENT: 24.8 %L (ref 10–50)
POC MID %: 5.5 %M (ref 0–12)
Platelet Count, POC: 306 10*3/uL (ref 142–424)
RBC: 5.06 M/uL (ref 4.04–5.48)
RDW, POC: 14.2 %
WBC: 6.8 10*3/uL (ref 4.6–10.2)

## 2014-02-04 MED ORDER — METHYLDOPA 250 MG PO TABS: 250.0000 mg | ORAL_TABLET | Freq: Two times a day (BID) | ORAL | Status: DC

## 2014-02-04 MED ORDER — LORAZEPAM 0.5 MG PO TABS: 0.5000 mg | ORAL_TABLET | Freq: Every evening | ORAL | Status: DC | PRN

## 2014-02-04 NOTE — Progress Notes (Signed)
This chart was scribed for Elvina SidleKurt Lauenstein MD by Tana ConchStephen Methvin, ED Scribe. This patient was seen in room 13 and the patient's care was started at 3:18 PM .  Subjective:    Patient ID: Vicki Jordan, female    DOB: 1970/11/23, 43 y.o.   MRN: [id number]  HPI  HPI Comments: Vicki Jordan is a 43 y.o. female who presents to the [facility] complaining that her BP was high last night when she measured it, the "bottom number was 97" and was "really low " when she checked it again this morning. Pt is confused as to why her BP has been so high and then low.She reports associated HA that began last night when her BP was high.   She states that she is trying to get pregnant and wants to know what would be a safe BP medication for her to take. She requests something to help her sleep on her trip.  Pt works in [employer].   Pt's BP at time of exam was 110/84   Review of Systems  Neurological: Positive for headaches.  All other systems reviewed and are negative.      Objective:   Physical Exam  Nursing note and vitals reviewed. Constitutional: She is oriented to person, place, and time. She appears well-developed and well-nourished.  HENT:  Head: Normocephalic and atraumatic.  Neck: Normal range of motion.  Cardiovascular:  BP 110/84  Pulmonary/Chest: Effort normal.  Musculoskeletal: Normal range of motion.  Neurological: She is alert and oriented to person, place, and time.      Filed Vitals:   02/04/14 1513  BP: 120/82  Pulse: 84  Temp: 98.7 F (37.1 C)  Resp: 18       Assessment & Plan:        3:25 PM-Discussed treatment plan which includes CDC with pt at bedside and pt agreed to plan.   Results for orders placed in visit on 02/04/14  POCT CBC      Result Value Ref Range   WBC 6.8  4.6 - 10.2 K/uL   Lymph, poc 2.4  0.6 - 3.4   POC LYMPH PERCENT 24.8  10 - 50 %L   MID (cbc) 0.4  0 - 0.9   POC MID % 5.5  0 - 12 %M   POC Granulocyte 4.1  2  - 6.9   Granulocyte percent 59.7  37 - 80 %G   RBC 5.06  4.04 - 5.48 M/uL   Hemoglobin 15.0  12.2 - 16.2 g/dL   HCT, POC 78.246.5  95.637.7 - 47.9 %   MCV 91.8  80 - 97 fL   MCH, POC 29.6  27 - 31.2 pg   MCHC 32.3  31.8 - 35.4 g/dL   RDW, POC 21.314.2     Platelet Count, POC 306  142 - 424 K/uL   MPV 9.2  0 - 99.8 fL   Results for orders placed in visit on 02/04/14  POCT CBC      Result Value Ref Range   WBC 6.8  4.6 - 10.2 K/uL   Lymph, poc 2.4  0.6 - 3.4   POC LYMPH PERCENT 24.8  10 - 50 %L   MID (cbc) 0.4  0 - 0.9   POC MID % 5.5  0 - 12 %M   POC Granulocyte 4.1  2 - 6.9   Granulocyte percent 59.7  37 - 80 %G   RBC 5.06  4.04 - 5.48 M/uL  Hemoglobin 15.0  12.2 - 16.2 g/dL   HCT, POC 30.846.5  65.737.7 - 47.9 %   MCV 91.8  80 - 97 fL   MCH, POC 29.6  27 - 31.2 pg   MCHC 32.3  31.8 - 35.4 g/dL   RDW, POC 84.614.2     Platelet Count, POC 306  142 - 424 K/uL   MPV 9.2  0 - 99.8 fL        I personally performed the services described in this documentation, which was scribed in my presence. The recorded information has been reviewed and is accurate.    Unspecified essential hypertension - Plan: POCT CBC, methyldopa (ALDOMET) 250 MG tablet  Travel advice encounter - Plan: LORazepam (ATIVAN) 0.5 MG tablet  Signed, Elvina SidleKurt Lauenstein, MD

## 2014-02-26 ENCOUNTER — Ambulatory Visit (INDEPENDENT_AMBULATORY_CARE_PROVIDER_SITE_OTHER): Payer: 59 | Admitting: Internal Medicine

## 2014-02-26 VITALS — BP 138/90 | HR 77 | Temp 98.6°F | Resp 16 | Ht 61.75 in | Wt 126.0 lb

## 2014-02-26 DIAGNOSIS — F17200 Nicotine dependence, unspecified, uncomplicated: Secondary | ICD-10-CM

## 2014-02-26 DIAGNOSIS — Z720 Tobacco use: Secondary | ICD-10-CM

## 2014-02-26 MED ORDER — BUPROPION HCL ER (SR) 150 MG PO TB12: 150.0000 mg | ORAL_TABLET | Freq: Two times a day (BID) | ORAL | Status: DC

## 2014-02-26 NOTE — Patient Instructions (Signed)
Use Wellbutrin as directed to assist you in stopping smoking. You may use tylenol pm to help you with insomnia. Get more exercise this will help you to sleep better. If you have any problems or need more support to stop smoking return to the office for further suggestions.

## 2014-02-26 NOTE — Progress Notes (Signed)
   Subjective:    Patient ID: Vicki Jordan, female    DOB: 1971/10/09, 43 y.o.   MRN: [id number]  HPI 43 year old Asian female here with 2 cc/requests 1. She is requesting medication to help her with smoking cessation. She used Wellbutrin in the past and was successful at stopping smoking but she restarted smoking in July and wants to stop again. She smokes 1 pack every 3 days. 2. She has insomnia and uses nyquil with variable success  And wants some other suggestions to help her sleep   Review of Systems  All other systems reviewed and are negative.      Objective:   Physical Exam  Nursing note and vitals reviewed. Constitutional: She is oriented to person, place, and time. She appears well-developed and well-nourished.  HENT:  Head: Normocephalic and atraumatic.  Right Ear: External ear normal.  Left Ear: External ear normal.  Mouth/Throat: Oropharynx is clear and moist.  Eyes: Conjunctivae and EOM are normal. Pupils are equal, round, and reactive to light.  Neck: Normal range of motion. Neck supple.  Cardiovascular: Normal rate, normal heart sounds and intact distal pulses.   Pulmonary/Chest: Effort normal and breath sounds normal.  Abdominal: Soft.  Musculoskeletal: Normal range of motion.  Neurological: She is alert and oriented to person, place, and time. She has normal reflexes.  Skin: Skin is warm and dry.  Psychiatric: She has a normal mood and affect. Her behavior is normal. Judgment and thought content normal.          Assessment & Plan:  43 year old female requests smoking cessation meds and help with insomnia. Will renew her Wellbutrin and suggest tylenol pm to help her with sleep.

## 2014-03-02 ENCOUNTER — Ambulatory Visit (INDEPENDENT_AMBULATORY_CARE_PROVIDER_SITE_OTHER): Payer: 59 | Admitting: Internal Medicine

## 2014-03-02 VITALS — BP 122/80 | HR 78 | Temp 98.1°F | Resp 16 | Ht 64.0 in | Wt 124.8 lb

## 2014-03-02 DIAGNOSIS — K219 Gastro-esophageal reflux disease without esophagitis: Secondary | ICD-10-CM

## 2014-03-02 DIAGNOSIS — R1013 Epigastric pain: Secondary | ICD-10-CM

## 2014-03-02 DIAGNOSIS — Z720 Tobacco use: Secondary | ICD-10-CM

## 2014-03-02 DIAGNOSIS — F17200 Nicotine dependence, unspecified, uncomplicated: Secondary | ICD-10-CM

## 2014-03-02 DIAGNOSIS — R519 Headache, unspecified: Secondary | ICD-10-CM

## 2014-03-02 DIAGNOSIS — R51 Headache: Secondary | ICD-10-CM

## 2014-03-02 DIAGNOSIS — Z79899 Other long term (current) drug therapy: Secondary | ICD-10-CM

## 2014-03-02 LAB — LIPID PANEL
CHOL/HDL RATIO: 5.1 ratio
Cholesterol: 225 mg/dL — ABNORMAL HIGH (ref 0–200)
HDL: 44 mg/dL (ref >39)
LDL Cholesterol: 159 mg/dL — ABNORMAL HIGH (ref 0–99)
TRIGLYCERIDES: 110 mg/dL (ref <150)
VLDL: 22 mg/dL (ref 0–40)

## 2014-03-02 LAB — COMPREHENSIVE METABOLIC PANEL
ALT: 14 U/L (ref 0–35)
AST: 16 U/L (ref 0–37)
Albumin: 4.7 g/dL (ref 3.5–5.2)
Alkaline Phosphatase: 76 U/L (ref 39–117)
BUN: 8 mg/dL (ref 6–23)
CALCIUM: 9.8 mg/dL (ref 8.4–10.5)
CHLORIDE: 101 meq/L (ref 96–112)
CO2: 24 meq/L (ref 19–32)
CREATININE: 0.68 mg/dL (ref 0.50–1.10)
Glucose, Bld: 90 mg/dL (ref 70–99)
POTASSIUM: 3.6 meq/L (ref 3.5–5.3)
Sodium: 139 mEq/L (ref 135–145)
TOTAL PROTEIN: 7.1 g/dL (ref 6.0–8.3)
Total Bilirubin: 1.3 mg/dL — ABNORMAL HIGH (ref 0.2–1.2)

## 2014-03-02 LAB — POCT URINALYSIS DIPSTICK
Bilirubin, UA: NEGATIVE
Blood, UA: NEGATIVE
GLUCOSE UA: NEGATIVE
Ketones, UA: NEGATIVE
Nitrite, UA: NEGATIVE
Protein, UA: NEGATIVE
Spec Grav, UA: 1.01
UROBILINOGEN UA: 0.2
pH, UA: 7

## 2014-03-02 LAB — POCT CBC
GRANULOCYTE PERCENT: 61.9 % (ref 37–80)
HEMATOCRIT: 43.1 % (ref 37.7–47.9)
HEMOGLOBIN: 13.7 g/dL (ref 12.2–16.2)
Lymph, poc: 1.4 (ref 0.6–3.4)
MCH: 30 pg (ref 27–31.2)
MCHC: 31.8 g/dL (ref 31.8–35.4)
MCV: 94.3 fL (ref 80–97)
MID (CBC): 0.3 (ref 0–0.9)
MPV: 9 fL (ref 0–99.8)
PLATELET COUNT, POC: 221 10*3/uL (ref 142–424)
POC Granulocyte: 2.8 (ref 2–6.9)
POC LYMPH PERCENT: 32.1 %L (ref 10–50)
POC MID %: 6 %M (ref 0–12)
RBC: 4.57 M/uL (ref 4.04–5.48)
RDW, POC: 14.9 %
WBC: 4.5 10*3/uL — AB (ref 4.6–10.2)

## 2014-03-02 LAB — POCT UA - MICROSCOPIC ONLY
Bacteria, U Microscopic: NEGATIVE
Casts, Ur, LPF, POC: NEGATIVE
Crystals, Ur, HPF, POC: NEGATIVE
Mucus, UA: NEGATIVE

## 2014-03-02 LAB — POCT URINE PREGNANCY: PREG TEST UR: NEGATIVE

## 2014-03-02 LAB — POCT SEDIMENTATION RATE: POCT SED RATE: 18 mm/h (ref 0–22)

## 2014-03-02 MED ORDER — OMEPRAZOLE 40 MG PO CPDR: 40.0000 mg | DELAYED_RELEASE_CAPSULE | Freq: Every day | ORAL | Status: DC

## 2014-03-02 MED ORDER — BUPROPION HCL ER (SR) 150 MG PO TB12: 150.0000 mg | ORAL_TABLET | Freq: Two times a day (BID) | ORAL | Status: DC

## 2014-03-02 NOTE — Patient Instructions (Addendum)
Gastroesophageal Reflux Disease, Adult Gastroesophageal reflux disease (GERD) happens when acid from your stomach flows up into the esophagus. When acid comes in contact with the esophagus, the acid causes soreness (inflammation) in the esophagus. Over time, GERD may create small holes (ulcers) in the lining of the esophagus. CAUSES   Increased body weight. This puts pressure on the stomach, making acid rise from the stomach into the esophagus.  Smoking. This increases acid production in the stomach.  Drinking alcohol. This causes decreased pressure in the lower esophageal sphincter (valve or ring of muscle between the esophagus and stomach), allowing acid from the stomach into the esophagus.  Late evening meals and a full stomach. This increases pressure and acid production in the stomach.  A malformed lower esophageal sphincter. Sometimes, no cause is found. SYMPTOMS   Burning pain in the lower part of the mid-chest behind the breastbone and in the mid-stomach area. This may occur twice a week or more often.  Trouble swallowing.  Sore throat.  Dry cough.  Asthma-like symptoms including chest tightness, shortness of breath, or wheezing. DIAGNOSIS  Your caregiver may be able to diagnose GERD based on your symptoms. In some cases, X-rays and other tests may be done to check for complications or to check the condition of your stomach and esophagus. TREATMENT  Your caregiver may recommend over-the-counter or prescription medicines to help decrease acid production. Ask your caregiver before starting or adding any new medicines.  HOME CARE INSTRUCTIONS   Change the factors that you can control. Ask your caregiver for guidance concerning weight loss, quitting smoking, and alcohol consumption.  Avoid foods and drinks that make your symptoms worse, such as:  Caffeine or alcoholic drinks.  Chocolate.  Peppermint or mint flavorings.  Garlic and onions.  Spicy foods.  Citrus fruits,  such as oranges, lemons, or limes.  Tomato-based foods such as sauce, chili, salsa, and pizza.  Fried and fatty foods.  Avoid lying down for the 3 hours prior to your bedtime or prior to taking a nap.  Eat small, frequent meals instead of large meals.  Wear loose-fitting clothing. Do not wear anything tight around your waist that causes pressure on your stomach.  Raise the head of your bed 6 to 8 inches with wood blocks to help you sleep. Extra pillows will not help.  Only take over-the-counter or prescription medicines for pain, discomfort, or fever as directed by your caregiver.  Do not take aspirin, ibuprofen, or other nonsteroidal anti-inflammatory drugs (NSAIDs). SEEK IMMEDIATE MEDICAL CARE IF:   You have pain in your arms, neck, jaw, teeth, or back.  Your pain increases or changes in intensity or duration.  You develop nausea, vomiting, or sweating (diaphoresis).  You develop shortness of breath, or you faint.  Your vomit is green, yellow, black, or looks like coffee grounds or blood.  Your stool is red, bloody, or black. These symptoms could be signs of other problems, such as heart disease, gastric bleeding, or esophageal bleeding. MAKE SURE YOU:   Understand these instructions.  Will watch your condition.  Will get help right away if you are not doing well or get worse. Document Released: 07/15/2005 Document Revised: 12/28/2011 Document Reviewed: 04/24/2011 [facility] Patient Information 2014 [place], [place]. Bupropion extended-release tablets (Depression/Mood Disorders) What is this medicine? BUPROPION (byoo PROE pee on) is used to treat depression. This medicine may be used for other purposes; ask your health care provider or pharmacist if you have questions. COMMON BRAND NAME(S): Aplenzin, Budeprion XL ,  Forfivo XL, Wellbutrin XL What should I tell my health care provider before I take this medicine? They need to know if you have any of these conditions: -an  eating disorder, such as anorexia or bulimia -bipolar disorder or psychosis -diabetes or high blood sugar, treated with medication -glaucoma -head injury or brain tumor -heart disease, previous heart attack, or irregular heart beat -high blood pressure -kidney or liver disease -seizures (convulsions) -suicidal thoughts or a previous suicide attempt -Tourette's syndrome -weight loss -an unusual or allergic reaction to bupropion, other medicines, foods, dyes, or preservatives -breast-feeding -pregnant or trying to become pregnant How should I use this medicine? Take this medicine by mouth with a glass of water. Follow the directions on the prescription label. You can take it with or without food. If it upsets your stomach, take it with food. Do not crush, chew, or cut these tablets. This medicine is taken once daily at the same time each day. Do not take your medicine more often than directed. Do not stop taking this medicine suddenly except upon the advice of your doctor. Stopping this medicine too quickly may cause serious side effects or your condition may worsen. A special MedGuide will be given to you by the pharmacist with each prescription and refill. Be sure to read this information carefully each time. Talk to your pediatrician regarding the use of this medicine in children. Special care may be needed. Overdosage: If you think you have taken too much of this medicine contact a poison control center or emergency room at once. NOTE: This medicine is only for you. Do not share this medicine with others. What if I miss a dose? If you miss a dose, skip the missed dose and take your next tablet at the regular time. Do not take double or extra doses. What may interact with this medicine? Do not take this medicine with any of the following medications: -linezolid -MAOIs like Azilect, Carbex, Eldepryl, Marplan, Nardil, and Parnate -methylene blue (injected into a vein) -other medicines that  contain bupropion like Zyban This medicine may also interact with the following medications: -alcohol -certain medicines for anxiety or sleep -certain medicines for blood pressure like metoprolol, propranolol -certain medicines for depression or psychotic disturbances -certain medicines for HIV or AIDS like efavirenz, lopinavir, nelfinavir, ritonavir -certain medicines for irregular heart beat like propafenone, flecainide -certain medicines for Parkinson's disease like amantadine, levodopa -certain medicines for seizures like carbamazepine, phenytoin, phenobarbital -cimetidine -clopidogrel -cyclophosphamide -furazolidone -isoniazid -nicotine -orphenadrine -procarbazine -steroid medicines like prednisone or cortisone -stimulant medicines for attention disorders, weight loss, or to stay awake -tamoxifen -theophylline -thiotepa -ticlopidine -tramadol -warfarin This list may not describe all possible interactions. Give your health care provider a list of all the medicines, herbs, non-prescription drugs, or dietary supplements you use. Also tell them if you smoke, drink alcohol, or use illegal drugs. Some items may interact with your medicine. What should I watch for while using this medicine? Tell your doctor if your symptoms do not get better or if they get worse. Visit your doctor or health care professional for regular checks on your progress. Because it may take several weeks to see the full effects of this medicine, it is important to continue your treatment as prescribed by your doctor. Patients and their families should watch out for new or worsening thoughts of suicide or depression. Also watch out for sudden changes in feelings such as feeling anxious, agitated, panicky, irritable, hostile, aggressive, impulsive, severely restless, overly excited and hyperactive, or  not being able to sleep. If this happens, especially at the beginning of treatment or after a change in dose, call  your health care professional. Avoid alcoholic drinks while taking this medicine. Drinking large amounts of alcoholic beverages, using sleeping or anxiety medicines, or quickly stopping the use of these agents while taking this medicine may increase your risk for a seizure. Do not drive or use heavy machinery until you know how this medicine affects you. This medicine can impair your ability to perform these tasks. Do not take this medicine close to bedtime. It may prevent you from sleeping. Your mouth may get dry. Chewing sugarless gum or sucking hard candy, and drinking plenty of water may help. Contact your doctor if the problem does not go away or is severe. The tablet shell for some brands of this medicine does not dissolve. This is normal. The tablet shell may appear whole in the stool. This is not a cause for concern. What side effects may I notice from receiving this medicine? Side effects that you should report to your doctor or health care professional as soon as possible: -allergic reactions like skin rash, itching or hives, swelling of the face, lips, or tongue -breathing problems -changes in vision -confusion -fast or irregular heartbeat -hallucinations -increased blood pressure -redness, blistering, peeling or loosening of the skin, including inside the mouth -seizures -suicidal thoughts or other mood changes -unusually weak or tired -vomiting Side effects that usually do not require medical attention (report to your doctor or health care professional if they continue or are bothersome): -change in sex drive or performance -constipation -headache -loss of appetite -nausea -tremors -weight loss This list may not describe all possible side effects. Call your doctor for medical advice about side effects. You may report side effects to FDA at 1-800-FDA-1088. Where should I keep my medicine? Keep out of the reach of children. Store at room temperature between 15 and 30 degrees  C (59 and 86 degrees F). Throw away any unused medicine after the expiration date. NOTE: This sheet is a summary. It may not cover all possible information. If you have questions about this medicine, talk to your doctor, pharmacist, or health care provider.  2014, [employer] Standard. (2013-04-28 12:39:42) B?nh Tro Ng??c D? Dy Th?c Qu?n, Ng??i L?n (Gastroesophageal Reflux Diseaes, Adult) B?nh tro ng??c d? dy th?c qu?n (GERD) x?y ra khi axit t? d? dy tro ln th?c qu?n. Khi axit ti?p xc v?i th?c qu?n, axit gy ra ?au (vim) trong th?c qu?n. Theo th?i gian, GERD c th? t?o ra cc l? nh? (cc v?t lot) ? nim m?c th?c qu?n.  NGUYN NHN  Tr?ng l??ng c? th? t?ng. ?i?u ny t?o p l?c ln d? dy, lm t?ng axit t? d? dy vo th?c qu?n.  Ht thu?c l. Ht thu?c l lm t?ng s?n sinh axit trong d? dy.  U?ng r??u. ?y l nguyn nhn lm gi?m p l?c trong c? th?t th?c qu?n d??i (van ho?c vng c? gi?a th?c qu?n v d? dy), cho php axit t? d? dy vo th?c qu?n.  ?n t?i mu?n v b?ng no. Tnh tr?ng ny lm t?ng p l?c c?ng nh? t?ng s?n sinh axit trong d? dy.  D? t?t c? th?t th?c qu?n d??i. ?i khi khng tm th?y nguyn nhn. TRI?U CH?NG  ?au rt ? ph?n d??i gi?a ng?c pha sau x??ng ?c v ? khu v?c gi?a d? dy. Hi?n t??ng ny c th? x?y ra hai l?n m?t tu?n ho?c th??ng xuyn h?n.  Kh nu?t.  ?au h?ng.  Ho khan.  Cc tri?u ch?ng gi?ng hen suy?n, bao g?m t?c ng?c,kh th? ho?c th? kh kh. CH?N ?ON Chuyn gia ch?m [place] s?c kh?e c th? ch?n ?on GERD d?a trn cc tri?u ch?ng c?a b?n. Trong m?t s? tr??ng h?p, ch?p X quang v cc xt nghi?m khc c th? ???c ti?n hnh ?? ki?m tra cc bi?n ch?ng ho?c tnh tr?ng c?a d? dy v th?c qu?n. ?I?U TR? Chuyn gia ch?m [place] s?c kh?e c th? khuy?n ngh? dng thu?c khng c?n k ??n ho?c thu?c c?n k ??n ?? gip gi?m s?n sinh axit. Hy h?i chuyn gia ch?m [place] s?c kh?e c?a b?n tr??c khi b?t ??u ho?c dng thm b?t k? lo?i thu?c m?i no. H??NG D?N CH?M [place] T?I  NH  Thay ??i cc y?u t? m b?n c th? ki?m sot ???c. H?i chuyn gia ch?m [place] s?c kh?e ?? ???c h??ng d?n v? vi?c gi?m cn, b? thu?c l v s? d?ng r??u.  [place] cc lo?i th?c ph?m v ?? u?ng lm cho cc tri?u ch?ng t?i t? h?n, ch?ng h?n nh?:  ?? u?ng c caffeine ho?c r??u.  S c la.  B?c h ho?c v? b?c h.  T?i v hnh ty.  Th?c ?n [place].  Tri cy h? cam, ch?ng h?n nh? cam, chanh hay chanh ty.  Cc th?c ?n c c chua, ch?ng h?n nh? n??c x?t, ?t, salsa (n??c x?t [place]) v bnh pizza.  Cc lo?i th?c ?n chin xo v nhi?u ch?t bo.  Trnh n?m xu?ng ng? 3 ti?ng tr??c gi? ?i ng? ho?c tr??c khi c m?t gi?c ng? ng?n.  ?n nh?ng b?a ?n nh?, th??ng xuyn h?n thay v cc b?a ?n l?n.  M?c qu?n o r?ng. Khng ?eo b?t c? th? g ch?t quanh th?t l?ng gy p l?c ln d? dy.  Nng ??u gi??ng cao ln t? 6 ??n 8 inch b?ng cc kh?i g? ?? gip b?n ng?. S? d?ng thm g?i s? khng c tc d?ng.  Ch? s? d?ng thu?c khng c?n k ??n ho?c thu?c c?n k ??n ?? gi?m ?au, gi?m c?m gic kh ch?u ho?c h? s?t theo ch? d?n c?a chuyn gia ch?m  s?c kh?e c?a b?n.  Khng dng thu?c atpirin, ibuprofen ho?c cc thu?c ch?ng vim khng c steroid (NSAID) khc. HY NGAY L?P T?C ?I KHM N?U:  B?n b? ?au ? cnh tay, c?, hm, r?ng ho?c l?ng.  Hi?n t??ng ?au t?ng ln ho?c thay ??i theo c??ng ?? ho?c th?i gian.  B?n b? bu?n nn, nn ho?c ?? m? hi (tot m? hi).  B?n b? kh th? ho?c ng?t x?u.  Ch?t nn c mu xanh l cy, vng, ?en ho?c trng gi?ng nh? b c ph ho?c mu.  Phn c mu ??, ?? nh? mu ho?c ?en. Nh?ng tri?u ch?ng ny c th? l d?u hi?u c?a cc v?n ?? khc, ch?ng h?n nh? b?nh tim, ch?y mu d? dy ho?c ch?y mu th?c qu?n. ??M B?O B?N:  Hi?u cc h??ng d?n ny.  S? theo di tnh tr?ng c?a mnh.  S? yu c?u tr? gip ngay l?p t?c n?u b?n c?m th?y khng ?? ho?c tnh tr?ng tr?m tr?ng h?n. Document Released: 07/15/2005 Document Revised: 06/07/2013 [facility] Patient Information 2014 [place], [place]. B?nh  Lot D? Dy (Peptic Ulcer Disease) B?nh Lot D? Dy l hi?n t??ng lot ? nim m?c c?a th?c qu?n (lot th?c qu?n), d? dy (lot d? dy) ho?c trong ph?n ??u tin c?a ru?t non (lot t  trng). Lot ?n mn vo cc m su h?n.  NGUYN NHN Thng th??ng, nim m?c d? dy v ru?t non t? b?o v? chnh n kh?i b? axit tiu ha th?c ?n ?n mn. Nim m?c b?o v? ny c th? b? t?n h?i do:  Nhi?m trng gy b?i m?t lo?i vi khu?n c tn Helicobacter pylori (H. pylori).  Th??ng xuyn s? d?ng thu?c khng vim khng c steroid (NSAID) nh? ibuprofen ho?c atpirin.  Ht thu?c l. Cc y?u t? nguy c? khc bao g?m: trn 50 tu?i, u?ng r??u qu m?c v c ti?n s? gia ?nh b? b?nh lot. TRI?U CH?NG  ?au rt ho?c g?m nh?m ? khu v?c gi?a ng?c v r?n.  ? nng.  Bu?n nn v nn m?a.  ??y h?i. C?n ?au c th? t?i t? h?n khi b?ng ?i v vo ban ?m. N?u ch? lot gy ch?y mu, n c th? gy ra:  Phn ?en, c d?ng h?c n.  Nn ra mu mu ?? t??i.  Nn m?a ra ch?t trng nh? b?t c ph. CH?N ?ON Ch?n ?on th??ng ???c th?c hi?n d?a trn ti?n s? c?a b?n v khm. Cc xt nghi?m v th? thu?t khc c th? ???c th?c hi?n ?? tm ra nguyn nhn lot. Vi?c tm ki?m nguyn nhn s? gip xc ??nh cch ?i?u tr? t?t nh?t. Cc xt nghi?m v th? thu?t c th? bao g?m:  Xt nghi?m mu, xt nghi?m phn ho?c xt nghi?m h?i th? ?? ki?m tra vi khu?n H. pylori.  H? th?ng ???ng tiu ha trn (GI) g?m th?c qu?n, d? dy v ru?t non.  N?i soi ?? ki?m tra th?c qu?n, d? dy v ru?t non.  Sinh thi?t. ?I?U TR? ?i?u tr? c th? bao g?m:  Lo?i b? nguyn nhn gy lot, ch?ng h?n nh? ht thu?c, NSAID ho?c r??u.  Thu?c ?? gi?m l??ng axit trong ???ng tiu ha.  Thu?c khng sinh n?u v?t lot l do vi khu?n H. pylori.  N?i soi ph?n trn ?? ?i?u tr? v?t lot ch?y mu.  Ph?u thu?t n?u ch?y mu n?ng ho?c n?u v?t lot t?o ra l? ? ?u ? trong h? tiu ha. H??NG D?N CH?M [place] T?I NH  Trnh thu?c l, r??u v caffeine. Ht thu?c c th? lm t?ng axit  trong d? dy v ti?p t?c ht thu?c s? lm gi?m s? ph?c h?i v?t lot.  Trnh th?c ?n v ?? u?ng c v? gy kh ch?u ho?c lm v?t lot n?ng thm.  Ch? s? d?ng thu?c theo ch? d?n c?a chuyn gia ch?m [place] s?c kh?e. Khng thay th? thu?c khng c?n k toa cho thu?c c?n k toa m khng ni chuy?n v?i chuyn gia ch?m [place] s?c kh?e.  Tun th? m?i cu?c h?n khm l?i v xt nghi?m, theo ch? d?n. HY ?I KHM N?U:  Tnh tr?ng c?a b?n khng c?i thi?n trong vng 7 ngy sau khi b?t ??u ?i?u tr?Marland Kitchen  B?n b? ch?ng kh tiu ho?c ? nng lin t?c. HY NGAY L?P T?C ?I KHM N?U:  B?n b? ?au b?ng ??t ng?t, ?au nhi ho?c ?au dai d?ng.  Phn c?a b?n c mu ho?c c mu ?en s?m nh? h?c n.  B?n nn ra mu ho?c ch?t gi?ng nh? b?t c ph.  B?n b? ?au ??u, y?u ho?c c?m th?y nh? mu?n ng?t.  B?n b? v m? hi ho?c l?nh. ??M B?O B?N:  Hi?u cc h??ng d?n ny.  S? theo di tnh tr?ng c?a mnh.  S? yu c?u tr? gip ngay  l?p t?c n?u b?n c?m th?y khng ?? ho?c tnh tr?ng tr?m tr?ng h?n. Document Released: 10/05/2005 Document Revised: 06/07/2013 [facility] Patient Information 2014 [place], [place]. Cai Thu?c L (Smoking Cessation) B? ht thu?c c  ngh?a quan tr?ng ??i v?i s?c kh?e c?a b?n v c nhi?u l?i ch. Tuy nhin, khng ph?i lun d? dng ?? b? v nicotine l m?t lo?i ch?t gy nghi?n. Thng th??ng, m?i ng??i c? g?ng t? 3 l?n tr? ln tr??c khi c th? b? thu?c l. Ti li?u ny gi?i thch nh?ng cch t?t nh?t ?? b?n c th? chu?n b? b? ht thu?c. B? thu?c l l vi?c lm kh kh?n v c?n r?t nhi?u n? l?c, nh?ng b?n c th? lm ?i?u ?. ?U ?I?M C?A B? HT THU?C B?n s? s?ng lu h?n, c?m th?y kh?e h?n v s?ng t?t h?n. C? th? c?a b?n s? c?m nh?n ???c tc ??ng c?a vi?c b? thu?c g?n nh? ngay l?p t?c. Trong vng 20 pht, huy?t p s? gi?m. M?ch ??p tr? l?i m?c bnh th??ng. Sau 8 gi?, n?ng ?? cacbon monoxit trong mu s? tr? v? bnh th??ng. N?ng ?? oxy c?a b?n t?ng. Sau 24 gi?, nguy c? b? ?au tim b?t ??u gi?m. H?i th?, tc v c? th? b?n s?  h?t mi khi thu?c. Sau 48 gi?, cc ??u dy th?n kinh b? t?n th??ng b?t ??u h?i ph?c. Kh??u gic v v? gic s? c?i thi?n. Sau 72 gi?, c? th? h?u nh? khng cn nicotine. ?ng ph? qu?n s? gin ra v th? d? h?n. Sau 2 ??n 12 tu?n, ph?i c th? gi? khng kh nhi?u h?n. T?p th? d?c tr? nn d? dng h?n v c?i thi?n tu?n hon. Nguy c? b? ?au tim, ??t qu?, ung th? ho?c b?nh ph?i gi?m ?ng k?. Sau 1 n?m, nguy c? b? b?nh tim m?ch vnh s? gi?m m?t n?a. Sau 5 n?m, nguy c? ??t qu? gi?m xu?ng gi?ng nh? m?t khng ht thu?c. Sau 10 n?m, nguy c? ung th? ph?i gi?m ?i m?t n?a v nguy c? b? b?nh ung th? khc s? gi?m ?ng k?. Sau 15 n?m, nguy c? c?a b?nh tim m?ch vnh gi?m, th??ng l b?ng m?c ?? c?a m?t ng??i khng ht thu?c. N?u b?n ?ang mang thai, b? ht thu?c s? c?i thi?n c? h?i c m?t em b kh?e m?nh. Nh?ng ng??i s?ng chung v?i b?n, ??c bi?t l m?i tr? em, s? kh?e m?nh h?n. B?n s? c thm ti?n ?? chi tiu vo nh?ng th? khc ngoi thu?c l. CC CU H?I C?N SUY NGH? TR??C KHI TM CCH B? THU?C L B?n c th? mu?n ni v? cc cu tr? l?i c?a b?n v?i chuyn gia ch?m [place] s?c kh?e. T?i sao b?n mu?n b? thu?c l? N?u b?n ? c? g?ng b? thu?c l trong qu kh?, nh?ng g ? gip ch b?n v nh?ng g khng gip ch cho b?n? ?u l nh?ng tnh hu?ng kh kh?n nh?t ??i v?i b?n sau khi b? thu?c l? B?n ??nh x? l chng nh? th? no? Ai c th? gip b?n v??t qua nh?ng th?i ?i?m kh kh?n? Gia ?nh? B?n b? Chuyn gia ch?m [place] s?c kh?e? B?n vui thch g khi ht thu?c l? B?n v?n c ni?m vui thch no n?u b? thu?c l? D??i ?y l m?t s? cu h?i ?? h?i chuyn gia ch?m [place] s?c kh?e: Lm th? no b?n c th? gip ti ?? b? thu?c l thnh cng? Theo b?n, lo?i d??c ph?m no s? l t?t nh?t cho ti  v ti nn dng n nh? th? no? Ti c?n lm g n?u ti c?n thm tr? gip? Cai thu?c s? c c?m gic nh? th? no? Lm th? no ti c th? nh?n ???c thng tin v? cai thu?c? CHU?N B? S?N SNG Ch?n ngy b? thu?c l. Thay ??i mi tr??ng c?a b?n b?ng cch  v?t b? t?t c? thu?c l, g?t tn, dim v b?t l?a trong nh, trn xe ho?c n?i lm vi?c. Khng cho php m?i ng??i ht thu?c trong nh b?n. Xem l?i n? l?c b? thu?c l tr??c ?y c?a b?n. Hy suy ngh? v? nh?ng g hi?u qu? v nh?ng g khng. NH?N H? TR? V KHUY?N [facility] B?n c nhi?u kh? n?ng thnh cng h?n n?u c s? gip ??. B?n c th? nh?n ???c h? tr? b?ng nhi?u cch. Ni v?i gia ?nh, b?n b v ??ng nghi?p r?ng b?n s? b? thu?c l v c?n s? h? tr? c?a h?. Yu c?u h? khng ht thu?c xung quanh b?n. Nh?n t? v?n v h? tr? c nhn, nhm ho?c qua ?i?n tho?i. Cc ch??ng trnh ???c cung c?p t?i cc b?nh vi?n v trung tm y t? ??a ph??ng. Hy g?i cho s? y t? ??a ph??ng ?? bi?t thng tin v? cc ch??ng trnh trong khu v?c c?a b?n. Ni?m tin v th?c hnh tm linh c th? gip m?t s? ng??i ht thu?c b? thu?c l. T?i v? "quit meter" (??ng h? b? thu?c l) trn my tnh c?a b?n ?? theo di s? li?u th?ng k b? thu?c l, ch?ng h?n nh? b?n ? khng ht thu?c ???c bao lu, s? l??ng thu?c khng ht v ti?n ti?t ki?m ???c. Nh?n m?t cu?n sch t? gip ?? v? b? ht thu?c v trnh xa thu?c l. H?C CC K? N?NG V HNH VI M?I T? ?nh l?c h??ng kh?i nhu c?u ht thu?c. Ni chuy?n v?i m?t ai ?, ?i b? ho?c gi?t th?i gian b?ng cng vi?c. Thay ??i thi quen bnh th??ng c?a b?n. Thay ??i l? trnh ??n n?i lm vi?c. U?ng tr thay cho c ph. ?n sng t?i m?t n?i khc. Gi?m b?t c?ng th?ng. T?m n??c nng, t?p th? d?c ho?c ??c sch. D? ??nh lm m?t vi?c g ? th v? m?i ngy. T? th??ng cho mnh do khng ht thu?c. Khm ph cc ch??ng trnh t??ng tc d?a trn web chuyn gip b?n b? thu?c l. MUA D??C PH?M V S? D?NG D??C PH?M H?P L D??c ph?m c th? gip b?n ng?ng ht thu?c v gi?m c?n thm thu?c l. K?t h?p d??c ph?m v?i cc ph??ng php hnh vi v h? tr? ? trn c th? t?ng ?ng k? kh? n?ng b? thu?c l thnh cng c?a b?n. Li?u php thay th? nicotine gip cung c?p nicotine cho c? th? c?a b?n m khng c?n tc ??ng tiu c?c v r?i ro c?a vi?c ht  thu?c. Li?u php thay th? nicotine bao g?m k?o, vin ng?m, thu?c ht, thu?c x?t m?i v mi?ng dn da ch?a nicotine. M?t s? khng c?n k toa, m?t s? khc c?n c toa c?a bc s?. Thu?c ch?ng tr?m c?m gip nh?ng ng??i b? ht thu?c, nh?ng khng r n ho?t ??ng nh? th? no. D??c ph?m ny c s?n theo toa. Thu?c c? ch? v?n m?t ph?n th? th? nicotinic m ph?ng hi?u ?ng c?a nicotin trong no c?a b?n. D??c ph?m ny c s?n theo toa. Hy h?i chuyn gia ch?m [place] s?c kh?e ?? ???c t? v?n v? nh?ng lo?i thu?c ?? s? d?ng v  cch s? d?ng chng d?a vo b?nh s? c?a b?n. Chuyn gia ch?m  s?c kh?e s? cho b?n bi?t c?n ??  nh?ng tc d?ng ph? no n?u b?n ch?n s? d?ng lo?i d??c ph?m ho?c li?u php. ??c k? thng tin trn bao b. Khng s? d?ng b?t k? s?n ph?m no khc c ch?a nicotine trong khi s? d?ng m?t s?n ph?m thay th? nicotine. TI PHT HO?C TNH HU?NG KH KH?N H?u h?t ti pht x?y ra trong vng 3 thng ??u tin sau khi b? thu?c. Khng n?n lng khi b?t ??u ht thu?c tr? l?i. Hy nh? r?ng, h?u h?t m?i ng??i c? g?ng nhi?u l?n tr??c b? h?n thu?c. B?n c th? c cc tri?u ch?ng t? b? v c? th? c?a b?n ? quen v?i nicotine. B?n c th? thm thu?c l, b? kch thch, c?m th?y r?t ?i, ho th??ng xuyn, b? ?au ??u, ho?c kh t?p trung. Cc tri?u ch?ng t? b? ch? l t?m th?i. Chng m?nh nh?t khi b?n b?t ??u b? thu?c l, nh?ng chng s? bi?n m?t trong vng 10-14 ngy. ?? gi?m nguy c? ti pht, c? g?ng: Trnh u?ng r??u. U?ng r??u lm gi?m c? h?i b? thu?c thnh cng c?a b?n. Gi?m l??ng caffeine b?n tiu th?. M?t khi b?n b? thu?c l, l??ng caffeine trong c? th? c?a b?n t?ng v c th? t?o cho b?n cc tri?u ch?ng nh? tim ??p nhanh, ?? m? hi v lo l?ng. Trnh nh?ng ng??i ht thu?c v h? c th? khi?n b?n mu?n ht thu?c. ??ng ?? vi?c t?ng cn lm b?n m?t t?p trung. Nhi?u ng??i ht thu?c s? t?ng cn khi b? thu?c, th??ng t h?n 10 pao (4,5 kg). ?n m?t ch? ?? ?n u?ng lnh m?nh v duy tr ho?t ??ng. B?n lun c th? gi?m s? cn ? t?ng sau khi b? thu?c  l. Tm cch ?? c?i thi?n tm tr?ng c?a b?n ngoi vi?c ht thu?c. ?? BI?T THM THNG TIN www.smokefree.gov Document Released: 01/20/2007 Document Revised: 06/07/2013 [facility] ChicagoExit[facility] Patient Information 2014 [place], [place].

## 2014-03-02 NOTE — Progress Notes (Signed)
   Subjective:    Patient ID: Vicki ForeJenny Jordan, female    DOB: 1971-10-02, 43 y.o.   MRN: [id number]  HPI    Review of Systems     Objective:   Physical Exam        Assessment & Plan:

## 2014-03-02 NOTE — Progress Notes (Signed)
   Subjective:    Patient ID: Vicki Jordan, female    DOB: [birth year], 43 y.o.   MRN: [id number]  HPI  43 YO female patient presents to the clinic today with complaints of abd pain after she eats. She states the pain is off and on and feels like something is stuck in her throat. She has the feeling she needs to burp. She has tried Nexium, Zyrtec with no relief. She has not been eating at all since yesterday. She states the only time she is better is with drinking water or eating a mint.   No history of GERD. No chest pain associated with this. No vomiting, fever or diarrhea.   She questions the bupropion giving her headaches. She started taking the medication on Wednesday. She takes it twice a day.  She is taking it to quit smoking.   She states she has not had her period in 2 months. She reports there is not any chance of pregnancy.  Review of Systems  Gastrointestinal: Positive for abdominal pain and diarrhea. Negative for nausea, vomiting, constipation, blood in stool, abdominal distention, anal bleeding and rectal pain.       Objective:   Physical Exam  Constitutional: She is oriented to person, place, and time. She appears well-developed and well-nourished. No distress.  HENT:  Head: Normocephalic.  Mouth/Throat: Oropharynx is clear and moist.  Eyes: EOM are normal. Pupils are equal, round, and reactive to light. No scleral icterus.  Neck: Normal range of motion. Neck supple.  Cardiovascular: Normal rate, regular rhythm and normal heart sounds.   Pulmonary/Chest: Effort normal. She has wheezes. She exhibits no tenderness.  Abdominal: Soft. Bowel sounds are normal. She exhibits no mass. There is no hepatosplenomegaly. There is tenderness in the epigastric area. There is no rigidity, no rebound, no guarding and no CVA tenderness.    Musculoskeletal: Normal range of motion.  Neurological: She is alert and oriented to person, place, and time. No cranial nerve deficit. She exhibits  normal muscle tone. Coordination normal.  Psychiatric: Her speech is normal and behavior is normal. Thought content normal. Cognition and memory are normal. She exhibits a depressed mood.   Labs done       Assessment & Plan:  Mid epigastric pain Nicotine addiction/Wants to quit smoking/RF Wellbutrin Stop NSAIDS/use tylenol for pain Trying to get pregnant with IVF in September!

## 2014-03-03 ENCOUNTER — Telehealth: Payer: Self-pay | Admitting: *Deleted

## 2014-03-03 LAB — TSH: TSH: 1.16 u[IU]/mL (ref 0.350–4.500)

## 2014-03-03 NOTE — Telephone Encounter (Signed)
PT STATES THAT THE MEDICINE THAT DR. Perrin MalteseGUEST GAVE HER IS NOT HELPING & THAT SHE IS HURTING VERY BAD. PLEASE CALL [phone]

## 2014-03-04 ENCOUNTER — Ambulatory Visit: Payer: 59

## 2014-03-04 ENCOUNTER — Ambulatory Visit (INDEPENDENT_AMBULATORY_CARE_PROVIDER_SITE_OTHER): Payer: 59 | Admitting: Family Medicine

## 2014-03-04 VITALS — BP 132/94 | HR 87 | Temp 97.7°F | Resp 15 | Ht 61.25 in | Wt 123.0 lb

## 2014-03-04 DIAGNOSIS — R079 Chest pain, unspecified: Secondary | ICD-10-CM

## 2014-03-04 DIAGNOSIS — K219 Gastro-esophageal reflux disease without esophagitis: Secondary | ICD-10-CM

## 2014-03-04 DIAGNOSIS — N926 Irregular menstruation, unspecified: Secondary | ICD-10-CM

## 2014-03-04 LAB — POCT URINE PREGNANCY: PREG TEST UR: NEGATIVE

## 2014-03-04 MED ORDER — SUCRALFATE 1 GM/10ML PO SUSP: 1.0000 g | Freq: Three times a day (TID) | ORAL | Status: DC

## 2014-03-04 NOTE — Telephone Encounter (Signed)
Pt says that the Prilosec is not helping at all. Feels like the pill "gets stuck" when she swallows it. Wants to know if we can call in something liquid for her

## 2014-03-04 NOTE — Progress Notes (Signed)
[facility] [address], [place] [id number] [phone]  Date:  03/04/2014   Name:  Vicki Jordan   DOB:  12-25-1970   MRN:  [id number]  PCP:  Tally DueGUEST, CHRIS WARREN, MD    Chief Complaint: Chest Pain   History of Present Illness:  Vicki Jordan is a 43 y.o. very pleasant female patient who presents with the following:  She is here today to recheck chest pain.  She was here 2 days ago with complaint of abdominal pain after eating.  Started on prilosec for presumed GERD.  However she does not feel it has helped her yet.   She states she has noted chest pain today- it is located in her left chest.  First noted it this morning. Is less now, but it still hurts her a little bit now, "like somebody pinch me."  It also hurts when she breaths.  She notes that her epigastric pain is still the same now.   No prior history of chest pain.  She denies any history of heart problems- records show history of HTN on aldomet.    Her breathing is ok except it does hurt when she breaths.    She feels as though the medication that she took this am "did not go down."  Wonders if she can have a liquid medication  There are no active problems to display for this patient.   Past Medical History  Diagnosis Date  . Hypertension     Past Surgical History  Procedure Laterality Date  . Tubal ligation      History  Substance Use Topics  . Smoking status: Former Smoker -- .5 years    Types: Cigarettes    Quit date: 08/13/2012  . Smokeless tobacco: Never Used  . Alcohol Use: No    No family history on file.  No Known Allergies  Medication list has been reviewed and updated.  Current Outpatient Prescriptions on File Prior to Visit  Medication Sig Dispense Refill  . buPROPion (WELLBUTRIN SR) 150 MG 12 hr tablet Take 1 tablet (150 mg total) by mouth 2 (two) times daily.  60 tablet  0  . methyldopa (ALDOMET) 250 MG tablet Take 1 tablet (250 mg total) by mouth 2 (two) times  daily.  180 tablet  3  . omeprazole (PRILOSEC) 40 MG capsule Take 1 capsule (40 mg total) by mouth daily.  30 capsule  3  . DM-Doxylamine-Acetaminophen (NYQUIL COLD & FLU PO) Take 5 mLs by mouth 2 (two) times daily as needed. For cold symptoms      . LORazepam (ATIVAN) 0.5 MG tablet Take 1 tablet (0.5 mg total) by mouth at bedtime and may repeat dose one time if needed.  30 tablet  1  . naproxen sodium (ALEVE) 220 MG tablet Take 440 mg by mouth 2 (two) times daily as needed. For headache pain       No current facility-administered medications on file prior to visit.    Review of Systems:  As per HPI- otherwise negative.   Physical Examination: Filed Vitals:   03/04/14 1302  BP: 132/94  Pulse: 87  Temp: 97.7 F (36.5 C)  Resp: 15   Filed Vitals:   03/04/14 1302  Height: 5' 1.25" (1.556 m)  Weight: 123 lb (55.792 kg)   Body mass index is 23.04 kg/(m^2). Ideal Body Weight: Weight in (lb) to have BMI = 25: 133.1  GEN: WDWN, NAD, Non-toxic, A & O x 3, healthy weight,  looks well HEENT: Atraumatic, Normocephalic. Neck supple. No masses, No LAD.  Bilateral TM wnl, oropharynx normal.  PEERL,EOMI.   Ears and Nose: No external deformity. CV: RRR, No M/G/R. No JVD. No thrill. No extra heart sounds. PULM: CTA B, no wheezes, crackles, rhonchi. No retractions. No resp. distress. No accessory muscle use. ABD: S, NT, ND, +BS. No rebound. No HSM. EXTR: No c/c/e NEURO Normal gait.  PSYCH: Normally interactive. Conversant. Not depressed or anxious appearing.  Calm demeanor.  Able to reproduce her CP by pressing on her sternum on the left side over the sternocostal joints.   Abdomen is benign- she has minimal epigastric discomfort but not RUQ pain, negative murphy's sign  EKG: NSR, no ST elevation or depression  [facility] reading (PRIMARY) by  Dr. Patsy Lageropland. CXR: negative  CHEST 2 VIEW  COMPARISON: None.  FINDINGS: The heart size and mediastinal contours are within normal limits. There is  no focal infiltrate, pulmonary edema, or pleural effusion. The visualized skeletal structures are unremarkable.  IMPRESSION: No active cardiopulmonary disease.  Results for orders placed in visit on 03/04/14  POCT URINE PREGNANCY      Result Value Ref Range   Preg Test, Ur Negative     Assessment and Plan: Chest pain - Plan: EKG 12-Lead, DG Chest 2 View  Irregular menses - Plan: POCT urine pregnancy  GERD (gastroesophageal reflux disease) - Plan: sucralfate (CARAFATE) 1 GM/10ML suspension  Discussed her sx in detail. She is still bothered by GERD- will add carafate to her regimen.  Offered further evaluation at the ED for he CP.  At this time she declines, but will seek care if any change or worsening of her sx.  Suspect that her chest pain is MSK in origin and suggested tylenol as needed to decrease GI upset.    Signed Abbe AmsterdamJessica Copland, MD

## 2014-03-04 NOTE — Telephone Encounter (Signed)
1) It takes longer than less than 24 hours to notice any benefits from Prilosec.   2) If she is hurting very bad I advise her to seek treatment.

## 2014-03-04 NOTE — Patient Instructions (Signed)
It seems that your chest pain is due to pain in the muscles and bones.  Please use tylenol OTC for this pain- avoid taking aleve or other NSAID medications which may irritate your stomach.  Continue to take the prilosec; if you wish you can open the capsule and pour the contents into water or on food.   Add the carafate before meals and before bed for 10 days.   Let me know if you are not getting better, sooner if you are getting worse

## 2014-03-04 NOTE — Telephone Encounter (Signed)
Pt was seen today.

## 2014-03-05 LAB — H. PYLORI ANTIBODY, IGG: H PYLORI IGG: 4.53 {ISR} — AB

## 2014-03-06 ENCOUNTER — Encounter: Payer: Self-pay | Admitting: Radiology

## 2014-03-17 ENCOUNTER — Telehealth: Payer: Self-pay

## 2014-03-17 DIAGNOSIS — IMO0002 Reserved for concepts with insufficient information to code with codable children: Secondary | ICD-10-CM

## 2014-03-17 NOTE — Telephone Encounter (Signed)
Pt called regarding an upcoming appt for a papsmear at another office. She states she has to have a referral from our office for this appointment w/Dr Vincente Poli at [facility] on Thursday. She has [employer]. Her information makes me think her insurance is [employer], but she doesn't have a card from them yet. She says Dr Patsy Lager is the dr on her card.   Please call pt with questions: [phone]  bf

## 2014-03-19 NOTE — Telephone Encounter (Signed)
Called her back.  She would like to see Dr. Vincente Poli for her pap and also for fertility concerns.  She has an appt on Thursday I will do this referral for her today

## 2014-05-14 ENCOUNTER — Ambulatory Visit (INDEPENDENT_AMBULATORY_CARE_PROVIDER_SITE_OTHER): Payer: 59 | Admitting: Family Medicine

## 2014-05-14 VITALS — BP 120/88 | HR 87 | Temp 98.1°F | Resp 16 | Ht 63.0 in | Wt 131.6 lb

## 2014-05-14 DIAGNOSIS — Z113 Encounter for screening for infections with a predominantly sexual mode of transmission: Secondary | ICD-10-CM

## 2014-05-14 DIAGNOSIS — R300 Dysuria: Secondary | ICD-10-CM

## 2014-05-14 DIAGNOSIS — A5901 Trichomonal vulvovaginitis: Secondary | ICD-10-CM

## 2014-05-14 DIAGNOSIS — A5903 Trichomonal cystitis and urethritis: Secondary | ICD-10-CM

## 2014-05-14 DIAGNOSIS — A5909 Other urogenital trichomoniasis: Secondary | ICD-10-CM

## 2014-05-14 DIAGNOSIS — I10 Essential (primary) hypertension: Secondary | ICD-10-CM

## 2014-05-14 DIAGNOSIS — N3000 Acute cystitis without hematuria: Secondary | ICD-10-CM

## 2014-05-14 LAB — POCT UA - MICROSCOPIC ONLY
Casts, Ur, LPF, POC: NEGATIVE
Crystals, Ur, HPF, POC: NEGATIVE
Trichomonas, UA: POSITIVE
Yeast, UA: NEGATIVE

## 2014-05-14 LAB — POCT URINALYSIS DIPSTICK
Bilirubin, UA: NEGATIVE
Glucose, UA: NEGATIVE
Ketones, UA: NEGATIVE
Nitrite, UA: NEGATIVE
Protein, UA: 30
Spec Grav, UA: 1.015
Urobilinogen, UA: 0.2
pH, UA: 7

## 2014-05-14 MED ORDER — METRONIDAZOLE 500 MG PO TABS: 500.0000 mg | ORAL_TABLET | Freq: Three times a day (TID) | ORAL | Status: DC

## 2014-05-14 MED ORDER — NITROFURANTOIN MONOHYD MACRO 100 MG PO CAPS: 100.0000 mg | ORAL_CAPSULE | Freq: Two times a day (BID) | ORAL | Status: DC

## 2014-05-14 MED ORDER — LABETALOL HCL 200 MG PO TABS: 200.0000 mg | ORAL_TABLET | Freq: Two times a day (BID) | ORAL | Status: DC

## 2014-05-14 NOTE — Progress Notes (Signed)
Subjective: 43 year old [place] lady who is here with dysuria recently. She has noticed a little yellow on the tissue when she wipes. She has not had a significant vaginal discharge or odor or vaginitis complaints. She has not been sexually involved since she was last with her husband a year ago. He is a [place]. This is his first marriage, her second. She has 4 children. She is going over to [place] in about 3 weeks with the intent of getting pregnant. She plans on getting IVF done there. She was placed on blood pressure medicine by her GYN, and wishes for a larger refill. She will be out of the country for 2 months at least. She works doing nails here. Otherwise she feels well. No chest pain shortness of breath.  Objective: Pleasant lady in no acute distress. Speaks good English. Chest clear. Heart regular without murmurs. Abdomen soft without masses or tenderness.   Results for orders placed in visit on 05/14/14  POCT URINALYSIS DIPSTICK      Result Value Ref Range   Color, UA yellow     Clarity, UA cloudy     Glucose, UA neg     Bilirubin, UA neg     Ketones, UA neg     Spec Grav, UA 1.015     Blood, UA trace     pH, UA 7.0     Protein, UA 30     Urobilinogen, UA 0.2     Nitrite, UA neg     Leukocytes, UA large (3+)    POCT UA - MICROSCOPIC ONLY      Result Value Ref Range   WBC, Ur, HPF, POC TNTC     RBC, urine, microscopic 5-8     Bacteria, U Microscopic 1+     Mucus, UA trace     Epithelial cells, urine per micros 4-8     Crystals, Ur, HPF, POC neg     Casts, Ur, LPF, POC neg     Yeast, UA neg     Trichomonas, UA Positive     After checking the urinalysis and decided not to do a vaginal exam and wet prep at this time. She has had her Pap elsewhere. She has not had sex since being with her husband a year ago, so I am assuming that she is bacteria no trichomonas for long time. In the emergency room notes 3 years ago she was treated for a urinary tract infection with  azithromycin, but in fact she had Trichomonas in her urine at that time.  Assessment: Urinary tract infection Trichomonas cystitis Trichomonas vaginitis Hypertension Desire to become pregnant  Plan: Treat for both a UTI and Trichomonas. Have her husband in bed and get hold of some metronidazole and get treated. Return if any symptoms after treatment before she goes back to [place]. Refill her blood pressure medicine for ninety-day quantity, return in 6 months.

## 2014-05-14 NOTE — Patient Instructions (Signed)
Take metronidazole one 3 times daily for Trichomonas infection for one week  Take nitrofurantoin one twice daily for urinary tract infection for one week  Drink plenty of fluids but no alcohol while you are on the above medicines  If you have any symptoms after finishing the medicines above please return  Continue taking your same blood pressure medication. Return in 6 months for followup with that  Contact your husband in [place] and tell him to purchase metronidazole 500 mg, 21 pills, and take it one pill 3 times daily. He probably has Trichomonas also, and I do not want that to be a problem for you when you return to [place] soon.  Nhi?m Trng ???ng Ti?t Ni?u (Urinary Tract Infection) Nhi?m trng ???ng ti?t ni?u (UTI) c th? pht tri?n ?? b?t c? vi? tri? na?o d?c ???ng ti?t ni?u. ???ng ti?t ni?u l h? th?ng thot n??c c?a c? th? ?? lo?i b? ch?t th?i v n??c d? th?a. ???ng ti?t ni?u bao g?m hai th?n, ni?u qu?n, bng quang v ni?u ??o. Th?n l m?t c?p c? quan hnh h?t ??u. M?i th?n co? kch th??c kho?ng b?ng bn tay c?a b?n. Chng n?m d??i x??ng s??n, m?i bn c?t s?ng c m?t qu?[place] NHN Nhi?m trng gy b?i vi sinh v?t, l cc sinh v?t nh?, bao g?m n?m, vi rt v vi khu?n. Nh?ng sinh v?t ny nh? t?i m?c chng ch? c th? ???c nhn th?y qua knh hi?n vi. Vi khu?n l nh?ng vi sinh v?t ph? bi?n nh?t gy ra UTI. TRI?U CH?NG Cc tri?u ch?ng c?a UTI c th? khc nhau, ty theo ?? tu?i v gi?i tnh c?a b?nh nhn v b?i v? tr nhi?m trng. Cc tri?u ch?ng ? ph? n? tr? th??ng bao g?m nhu c?u ti?u ti?n th??ng xuyn v d? d?i, c?m gic ?au v rt ? bng quang ho?c ni?u ??o khi ?i ti?u. Ph? n? v nam gi?i l?n tu?i c nhi?u kh? n?ng b? m?t m?i, run r?y v y?u, ??ng th?i b? ?au c? v ?au b?ng. S?t c th? c ngh?a l nhi?m trng ? th?n. Cc tri?u ch?ng khc c?a nhi?m trng th?n bao g?m ?au ? l?ng ho?c hai bn d??i x??ng s??n, bu?n nn v nn m?a. CH?N ?ON ?? ch?n ?on UTI, chuyn gia ch?m [place] s?c kh?e  s? h?i b?n v? nh?ng tri?u ch?ng c?a b?n. Chuyn gia ch?m [place] s?c kh?e c?ng s? yu c?u cung c?p m?u n??c ti?u. M?u n??c ti?u s? ???c xt nghi?m xem c vi khu?n v cc t? bo mu tr?ng khng. Cc t? bo mu tr?ng ???c t?o b?i c? th? ?? gip ch?ng l?i nhi?m trng. ?I?U TR? Thng th??ng, UTI c th? ???c ?i?u tr? b?ng thu?c. B?i v h?u h?t nguyn nhn gy ra UTI l do nhi?m trng b?i vi khu?n, chng th??ng c th? ???c ?i?u tr? b?ng cch s? d?ng thu?c khng sinh. Vi?c l?a ch?n khng sinh v th?i gian ?i?u tr? ph? thu?c vo tri?u ch?ng v lo?i vi khu?n gy nhi?m trng. H??NG D?N CH?M [place] T?I NH  N?u b?n ? ???c k khng sinh, hy s? d?ng chng ?ng nh? chuyn gia ch?m [place] s?c kh?e h??ng d?n. S? d?ng h?t thu?c ngay c? khi b?n c?m th?y kh h?n sau khi ch? dng m?t ph?n thu?c.  U?ng ?? n??c v dung d?ch ?? n??c ti?u trong ho?c c mu vng nh?t.  Trnh caffeine, tr v ?? u?ng c ga. Chng c xu h??ng kch thch bng quang.  ?  i ti?u th??ng xuyn. Trnh nh?n ti?u trong th?i gian di.  ?i ti?u tr??c v sau khi quan h? tnh d?c.  Sau khi ?i ??i ti?n, ph? n? c?n lm s?ch t? tr??c ra sau. Ch? s? d?ng gi?y v? sinh m?t l?n. HY ?I KHM N?U:  B?n b? ?au l?ng.  B?n b? s?t.  Cc tri?u ch?ng c?a b?n khng b?t ??u ?? trong vng 3 ngy. HY NGAY L?P T?C ?I KHM N?U:  B?n b? ?au l?ng ho?c ?au b?ng d??i nghim tr?ng.  B?n b? ?n l?nh.  B?n b? bu?n nn ho?c nn m?a.  B?n lin t?c b? rt ho?c kh ch?u khi ?i ti?u. ??M B?O B?N:  Hi?u cc h??ng d?n ny.  S? theo di tnh tr?ng c?a mnh.  S? yu c?u tr? gip ngay l?p t?c n?u b?n c?m th?y khng ?? ho?c tnh tr?ng tr?m tr?ng h?n. Document Released: 10/05/2005 Document Revised: 06/07/2013 [facility] Patient Information 2015 [place], [place]. This information is not intended to replace advice given to you by your health care provider. Make sure you discuss any questions you have with your health care provider.  B?nh trichomonas (Trichomoniasis) B?nh  trichomonas l m?t nhi?m trng do m?t sinh v?t c tn lTrichomonas gy ra. Nhi?m trng c th? ?nh h??ng ??n c? nam gi?i v n? gi?i. ? ph? n?, b? ph?n sinh d?c ngoi c?a n? gi?i v m ??o b? ?nh h??ng. ? nam gi?i, ch? y?u ?nh h??ng ??n d??ng v?t, nh?ng tuy?n ti?n li?t v cc c? quan sinh s?n khc c?ng c th? b? nhi?m b?nh. B?nh trichomonas l b?nh ly truy?n qua ???ng tnh d?c (STD) v th??ng hay ly nh?t sang ng??i khc qua quan h? tnh d?c.  CC Y?U T? NGUY C?  Giao h?p khng c bi?n php b?o v?.  Giao h?p v?i ng??i b? nhi?m b?nh. D?U HI?U V TRI?U CH?NG  Cc tri?u ch?ng nhi?m trichomonia ? ph? n? bao g?m:  Ra kh h? m ??o c b?t mu xanh xm b?t th??ng.  Ng?a v kch ?ng m ??o.  Ng?a v kch ?ng vng bn ngoi m ??o. Cc tri?u ch?ng c?a b?nh trichomonia ? nam gi?i bao g?m:   Ch?y d?ch t? d??ng v?t km theo ho?c khng km theo ?au.  ?au khi ?i ti?u. Tnh tr?ng ny l do vim ni?u ??o. CH?N ?ON  B?nh trichomonas c th? ???c pht hi?n khi lm xt nghi?m Pap ho?c khm th?c th?Shaune Pascal. Chuyn gia ch?m [place] s?c kh?e c th? s? d?ng m?t trong nh?ng ph??ng php sau ?? ch?n ?on b?nh nhi?m trng ny:  Xt nghi?m d?ch m ??o d??i knh hi?n vi. ??i v?i nam gi?i, s? ki?m tra d?ch ni?u ??o.  Ki?m tra ?? pH trong m ??o b?ng m?t b?ng gi?y xt nghi?m.  B?ng cch s? d?ng xt nghi?m ph?t m ??o b?ng t?m bng ?? ki?m tra vi sinh v?t Trichomonas. Xt nghi?m ny cho k?t qu? trong vng vi pht.  Lm xt nghi?m nui c?y tm vi khu?n. Xt nghi?m ny th??ng khng c?n thi?t. ?I?U TR?   Qu v? c th? ???c k dng thu?c ch?ng nhi?m trng. N? gi?i c?n thng bo cho chuyn gia ch?m [place] s?c kh?e n?u c th? c thai ho?c ?ang c [place]. M?t s? lo?i thu?c ???c s? d?ng ?? ?i?u tr? nhi?m trng khng nn ???c dng trong qu trnh [place] thai.  Chuyn gia ch?m  s?c kh?e c th? khuyn dng thu?c ho?c kem thu?c bi da mua khng  c?n k ??n ?? lm gi?m ng?a ho?c lm gi?m kch ?ng.  B?n tnh c?a qu v? s? ???c ?i?u tr? n?u  b? nhi?m b?nh. H??NG D?N CH?M [place] T?I NH   Ch? s? d?ng thu?c theo ch? d?n c?a chuyn gia ch?m [place] s?c kh?e.  S? d?ng thu?c khng c?n k ??n ?? ?i?u tr? ng?a ho?c kch ?ng theo ch? d?n c?a chuyn gia ch?m [place] s?c kh?e.  Khng giao h?p khi qu v? b? nhi?m khu?n.  N? gi?i khng nn th?t r?a ho?c dng nt v? sinh ph? n? khi h? b? nhi?m khu?n.  Bn b?c v? v?n ?? nhi?m khu?n v?i b?n tnh. B?n tnh c?a qu v? c th? ? b? nhi?m khu?n t? qu v?, ho?c qu v? c th? ? b? nhi?m khu?n t? b?n tnh.  Cho b?n tnh c?a qu v? ?i khm v ?i?u tr?, n?u c?n.  Th?c hnh tnh d?c an ton, c hi?u bi?t, v ???c b?o v?.  ??n g?p chuyn gia ch?m [place] s?c kh?e ?? lm xt nghi?m STI. ?I KHM N?U:   Qu v? v?n c tri?u ch?ng sau khi dng h?t thu?c.  Qu v? b? ?au b?ng.  Qu v? b? ?au khi ?i ti?u.  Qu v? b? ch?y mu sau khi giao h?p.  Qu v? b? pht ban.  Thu?c lm cho qu v? bu?n nn ho?c lm qu v? nn ra (nn m?a). ??M B?O QU V?:  Hi?u r cc h??ng d?n ny.  S? theo di tnh tr?ng c?a mnh.  S? yu c?u tr? gip ngay l?p t?c n?u qu v? c?m th?y khng kh?e ho?c th?y tr?m tr?ng h?n. Document Released: 10/05/2005 Document Revised: 02/19/2014 [facility] Patient Information 2015 [place], [place]. This information is not intended to replace advice given to you by your health care provider. Make sure you discuss any questions you have with your health care provider.

## 2014-05-15 LAB — HIV ANTIBODY (ROUTINE TESTING W REFLEX): HIV: NONREACTIVE

## 2014-05-15 LAB — RPR

## 2014-05-16 LAB — GC/CHLAMYDIA PROBE AMP
CT Probe RNA: NEGATIVE
GC Probe RNA: NEGATIVE

## 2014-05-16 LAB — URINE CULTURE: Colony Count: 15000

## 2014-05-22 ENCOUNTER — Telehealth: Payer: Self-pay | Admitting: Family Medicine

## 2014-05-22 NOTE — Telephone Encounter (Signed)
Patient is wanting a refill of her blood pressure medication. Labetalol 200mg . She is going out of town for the next 2 months and needs the medication before she leaves. She states that at her last visit she explained this to you and it was suppose to be sent to her pharmacy.  [facility]

## 2014-05-23 NOTE — Telephone Encounter (Signed)
Advised pt to check with pharmacy.  States she saw she had a refill on her bottle but didn't know what that ment.

## 2014-05-23 NOTE — Telephone Encounter (Signed)
One month ago I gave her a prescription for 90 days with one refill. She should check with her pharmacy.

## 2014-05-24 ENCOUNTER — Telehealth: Payer: Self-pay

## 2014-05-24 NOTE — Telephone Encounter (Signed)
Patient walked in to switch from labetalol HCl 200 mg to omeprazole 40mg   Patient says with labetalol that it is causing headaches and blood pressure to stay high. She thought that Dr. Alwyn RenHopper gave her omeprazole 120 mg. as refilled which she is used to. From dr. Perlie Goldgreywall [facility].     Please advise thank you  Best: [phone]  [id number] [place], [place] - [address]

## 2014-05-24 NOTE — Telephone Encounter (Signed)
Spoke to pt, i explained to her labetelol is used to treat hypertension, omeprazole is used to treat gerd or acid reflux, she stated she may have gotten her medications confused, she will look at her bottles when she gets home and call back with the correct information.

## 2014-05-27 IMAGING — CR DG CHEST 2V
2 series · 2 of 2 positions shown · non-contrast
Comparison: None.

CLINICAL DATA: Chest pain today day

EXAM:
CHEST  2 VIEW

[PA]
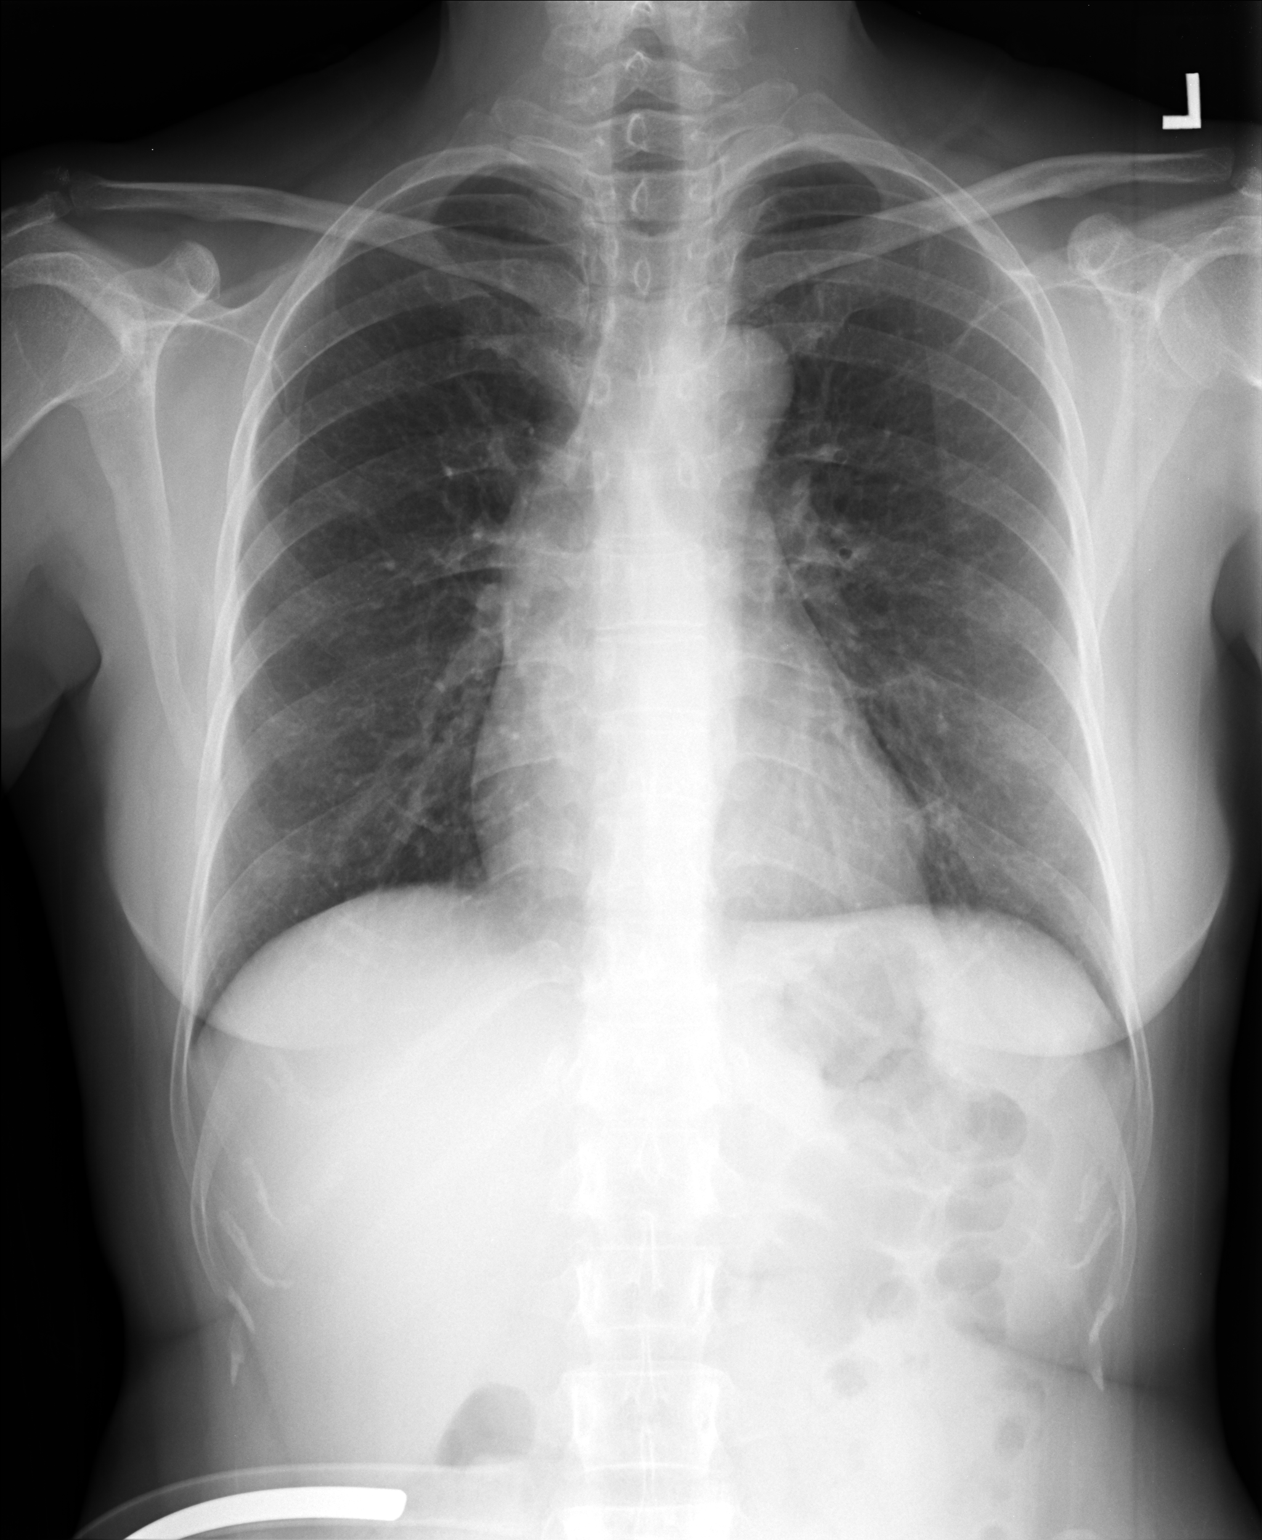

[lateral]
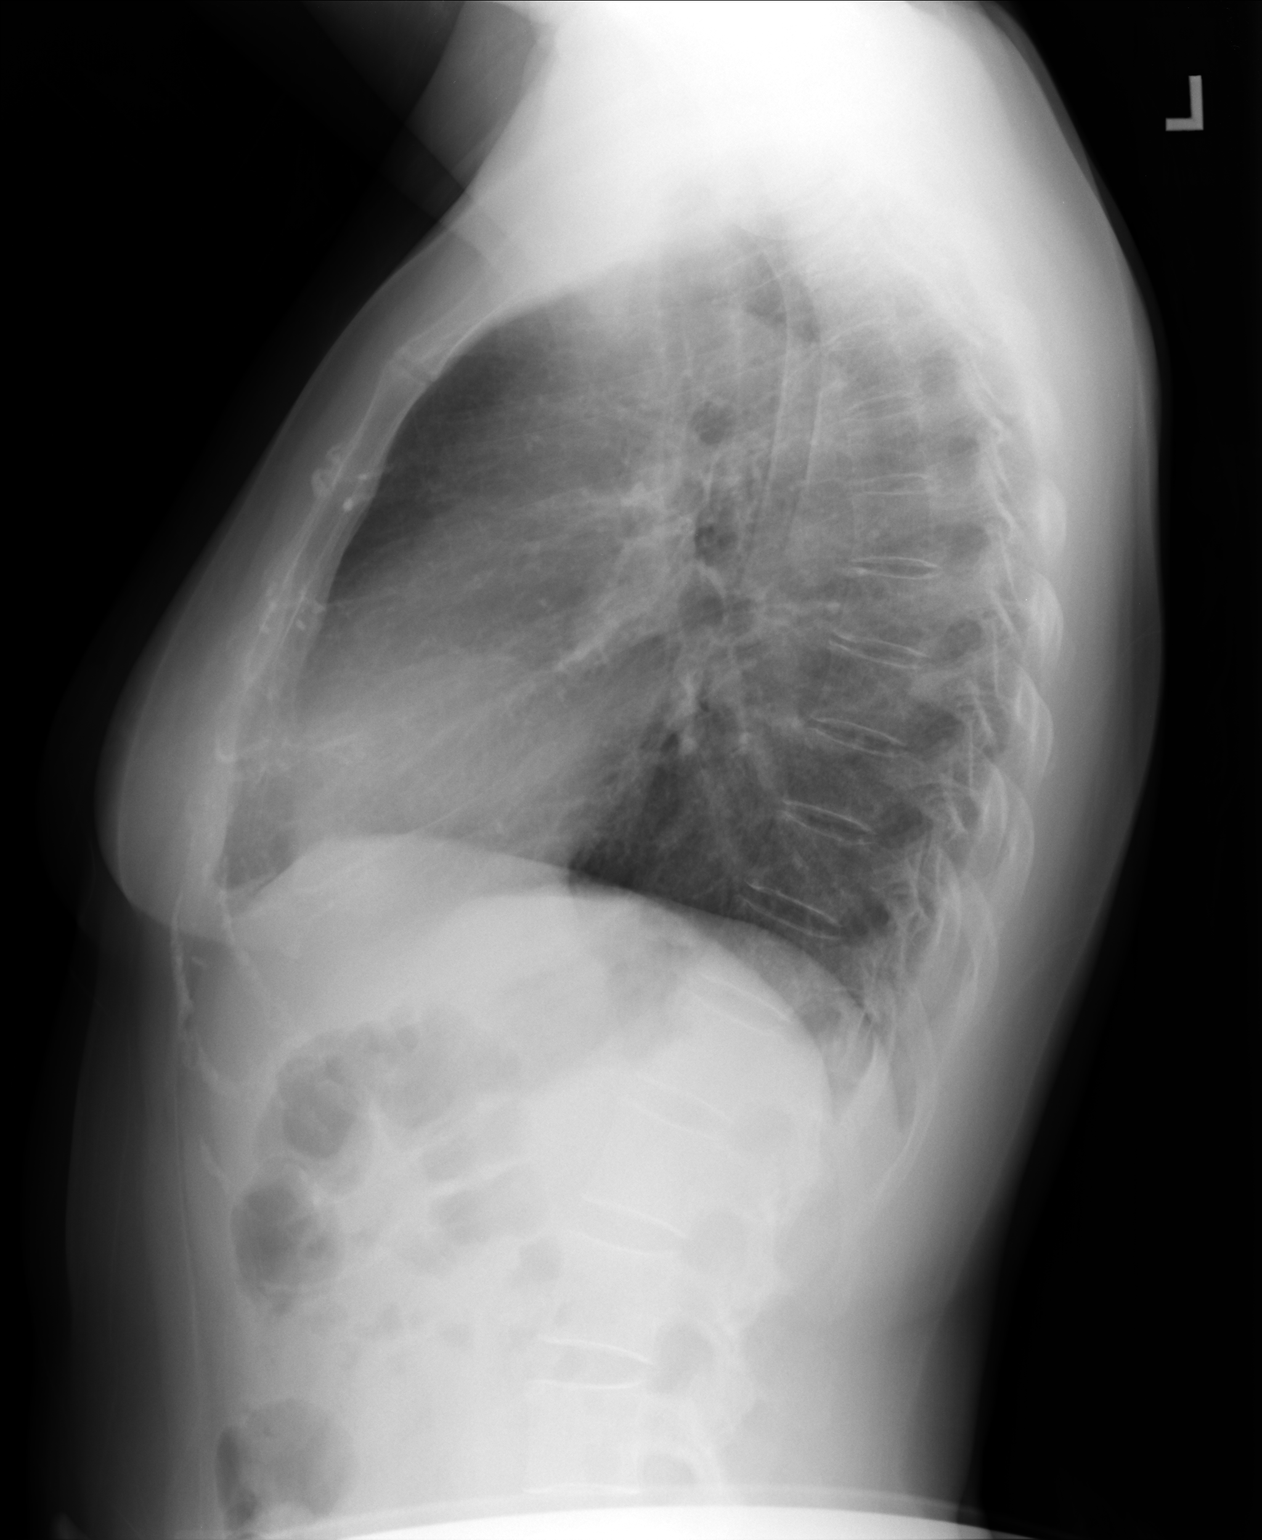

[2 of 2 positions shown; findings below may reference images not displayed]

FINDINGS: The heart size and mediastinal contours are within normal limits.
There is no focal infiltrate, pulmonary edema, or pleural effusion.
The visualized skeletal structures are unremarkable.
IMPRESSION: No active cardiopulmonary disease.

## 2016-04-14 ENCOUNTER — Ambulatory Visit (INDEPENDENT_AMBULATORY_CARE_PROVIDER_SITE_OTHER): Payer: Self-pay | Admitting: Family Medicine

## 2016-04-14 ENCOUNTER — Encounter: Payer: Self-pay | Admitting: Family Medicine

## 2016-04-14 VITALS — BP 138/88 | HR 86 | Temp 98.1°F | Resp 16 | Ht 63.0 in | Wt 133.0 lb

## 2016-04-14 DIAGNOSIS — I10 Essential (primary) hypertension: Secondary | ICD-10-CM | POA: Insufficient documentation

## 2016-04-14 DIAGNOSIS — Z720 Tobacco use: Secondary | ICD-10-CM

## 2016-04-14 LAB — BASIC METABOLIC PANEL
BUN: 13 mg/dL (ref 7–25)
CALCIUM: 9.1 mg/dL (ref 8.6–10.2)
CHLORIDE: 105 mmol/L (ref 98–110)
CO2: 23 mmol/L (ref 20–31)
Creat: 0.75 mg/dL (ref 0.50–1.10)
Glucose, Bld: 68 mg/dL (ref 65–99)
Potassium: 4 mmol/L (ref 3.5–5.3)
SODIUM: 139 mmol/L (ref 135–146)

## 2016-04-14 MED ORDER — HYDROCHLOROTHIAZIDE 12.5 MG PO CAPS: 12.5000 mg | ORAL_CAPSULE | Freq: Every day | ORAL | Status: DC

## 2016-04-14 NOTE — Patient Instructions (Addendum)
Please check your blood pressure twice a week and write it down on a piece of paper. Please bring with you to your next visit.  Come back in before you run out of your medicine  You Can Quit Smoking If you are ready to quit smoking or are thinking about it, congratulations! You have chosen to help yourself be healthier and live longer! There are lots of different ways to quit smoking. Nicotine gum, nicotine patches, a nicotine inhaler, or nicotine nasal spray can help with physical craving. Hypnosis, support groups, and medicines help break the habit of smoking. TIPS TO GET OFF AND STAY OFF CIGARETTES  Learn to predict your moods. Do not let a bad situation be your excuse to have a cigarette. Some situations in your life might tempt you to have a cigarette.  Ask friends and co-workers not to smoke around you.  Make your home smoke-free.  Never have "just one" cigarette. It leads to wanting another and another. Remind yourself of your decision to quit.  On a card, make a list of your reasons for not smoking. Read it at least the same number of times a day as you have a cigarette. Tell yourself everyday, "I do not want to smoke. I choose not to smoke."  Ask someone at home or work to help you with your plan to quit smoking.  Have something planned after you eat or have a cup of coffee. Take a walk or get other exercise to perk you up. This will help to keep you from overeating.  Try a relaxation exercise to calm you down and decrease your stress. Remember, you may be tense and nervous the first two weeks after you quit. This will pass.  Find new activities to keep your hands busy. Play with a pen, coin, or rubber band. Doodle or draw things on paper.  Brush your teeth right after eating. This will help cut down the craving for the taste of tobacco after meals. You can try mouthwash too.  Try gum, breath mints, or diet candy to keep something in your mouth. IF YOU SMOKE AND WANT TO  QUIT:  Do not stock up on cigarettes. Never buy a carton. Wait until one pack is finished before you buy another.  Never carry cigarettes with you at work or at home.  Keep cigarettes as far away from you as possible. Leave them with someone else.  Never carry matches or a lighter with you.  Ask yourself, "Do I need this cigarette or is this just a reflex?"  Bet with someone that you can quit. Put cigarette money in a piggy bank every morning. If you smoke, you give up the money. If you do not smoke, by the end of the week, you keep the money.  Keep trying. It takes 21 days to change a habit!  Talk to your doctor about using medicines to help you quit. These include nicotine replacement gum, lozenges, or skin patches.   This information is not intended to replace advice given to you by your health care provider. Make sure you discuss any questions you have with your health care provider.   Document Released: 08/01/2009 Document Revised: 12/28/2011 Document Reviewed: 08/01/2009 Elsevier Interactive Patient Education 2016 [employer]     IF you received an x-ray today, you will receive an invoice from [facility]. Please contact [facility] at [phone] with questions or concerns regarding your invoice.   IF you received labwork today, you will receive an  invoice from [facility]. Please contact [facility] at [phone] with questions or concerns regarding your invoice.   Our billing staff will not be able to assist you with questions regarding bills from these companies.  You will be contacted with the lab results as soon as they are available. The fastest way to get your results is to activate your My Chart account. Instructions are located on the last page of this paperwork. If you have not heard from us regarding the results in 2 weeks, please contact this office.

## 2016-04-14 NOTE — Progress Notes (Signed)
   Subjective:    Patient ID: Vicki Jordan, female    DOB: [birth year], 45 y.o.   MRN: [id number]  HPI This is a 45 yo female who presents today for refill of labetalol. She has not been seen here in almost 2 years. She reports that she was getting refills from Dr. Eustaquio BoydenGreywal. She states she has been taking her medication until 3 days ago, but has been taking once a day. She has her bottle with her for labetalol 200 mg po bid #60. This was filled 3/17. She checks her blood pressure at home and it runs 120-138/69-88. She would like to stay off the labetalol and go back to HCTZ. She was switched to labetalol when she was contemplating another pregnancy. She is not sexually active. Her husband lives in [place]. She has 4 grown children. 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 She smokes 1/3-1/2 ppd. She is interested in quitting.   Past Medical History  Diagnosis Date  . Hypertension    Past Surgical History  Procedure Laterality Date  . Tubal ligation     No family history on file.  Social History  Substance Use Topics  . Smoking status: Current Every Day Smoker -- .5 years    Types: Cigarettes    Last Attempt to Quit: 08/13/2012  . Smokeless tobacco: Never Used  . Alcohol Use: No      Review of Systems No chest pain, no SOB, no abdominal pain, no urinary symtpoms    Objective:   Physical Exam Physical Exam  Constitutional: Oriented to person, place, and time. She appears well-developed and well-nourished.  HENT:  Head: Normocephalic and atraumatic.  Eyes: Conjunctivae are normal.  Neck: Normal range of motion. Neck supple.  Cardiovascular: Normal rate, regular rhythm and normal heart sounds. No edema.    Pulmonary/Chest: Effort normal and breath sounds normal.  Musculoskeletal: Normal range of motion.  Neurological: Alert and oriented to person, place, and time.  Skin: Skin is warm and dry.  Psychiatric: Normal mood and affect. Behavior is normal. Judgment and thought content normal.    Vitals reviewed.   BP 138/88 mmHg  Pulse 86  Temp(Src) 98.1 F (36.7 C) (Oral)  Resp 16  Ht 5\' 3"  (1.6 m)  Wt 133 lb (60.328 kg)  BMI 23.57 kg/m2  LMP 03/09/2016 (Approximate)     Assessment & Plan:  1. Essential hypertension - instructed her to check her blood pressure 2x/week and bring readings with her to next visit, she is to come for follow up prior to running out of her 90 day supply - Basic metabolic panel - hydrochlorothiazide (MICROZIDE) 12.5 MG capsule; Take 1 capsule (12.5 mg total) by mouth daily.  Dispense: 90 capsule; Refill: 0  2. Tobacco abuse - encouraged smoking cessation and provided written tips for success   Olean Reeeborah Cortny Bambach, FNP-BC  [facility]  04/14/2016 11:12 AM

## 2016-07-22 ENCOUNTER — Other Ambulatory Visit: Payer: Self-pay | Admitting: Family Medicine

## 2016-07-22 DIAGNOSIS — I10 Essential (primary) hypertension: Secondary | ICD-10-CM

## 2016-07-24 ENCOUNTER — Telehealth: Payer: Self-pay | Admitting: Family Medicine

## 2016-07-24 NOTE — Telephone Encounter (Signed)
Pt needing a refill on her BP medicine please respond she need it sent to the [facility] in [place] she is out of her medicine

## 2016-07-27 ENCOUNTER — Ambulatory Visit (INDEPENDENT_AMBULATORY_CARE_PROVIDER_SITE_OTHER): Payer: Self-pay | Admitting: Family Medicine

## 2016-07-27 VITALS — BP 130/80 | HR 85 | Temp 98.2°F | Resp 17 | Ht 63.0 in | Wt 133.0 lb

## 2016-07-27 DIAGNOSIS — Z720 Tobacco use: Secondary | ICD-10-CM

## 2016-07-27 DIAGNOSIS — R519 Headache, unspecified: Secondary | ICD-10-CM

## 2016-07-27 DIAGNOSIS — R51 Headache: Secondary | ICD-10-CM

## 2016-07-27 DIAGNOSIS — I10 Essential (primary) hypertension: Secondary | ICD-10-CM

## 2016-07-27 LAB — BASIC METABOLIC PANEL
BUN: 11 mg/dL (ref 7–25)
CALCIUM: 9.3 mg/dL (ref 8.6–10.2)
CO2: 23 mmol/L (ref 20–31)
CREATININE: 0.68 mg/dL (ref 0.50–1.10)
Chloride: 105 mmol/L (ref 98–110)
Glucose, Bld: 86 mg/dL (ref 65–99)
Potassium: 3.9 mmol/L (ref 3.5–5.3)
SODIUM: 140 mmol/L (ref 135–146)

## 2016-07-27 MED ORDER — ACETAMINOPHEN 500 MG PO TABS
500.0000 mg | ORAL_TABLET | Freq: Once | ORAL | Status: AC
  Administered 2016-07-27: 500 mg via ORAL

## 2016-07-27 MED ORDER — HYDROCHLOROTHIAZIDE 12.5 MG PO CAPS: 12.5000 mg | ORAL_CAPSULE | Freq: Every day | ORAL | 0 refills | Status: DC

## 2016-07-27 NOTE — Progress Notes (Addendum)
By signing my name below I, Shelah Lewandowsky, attest that this documentation has been prepared under the direction and in the presence of Shade Flood, MD. Electonically Signed. Shelah Lewandowsky, Scribe 07/27/2016 at 8:25 AM  Subjective:    Patient ID: Vicki Jordan, female    DOB: November 03, 1970, 45 y.o.   MRN: [id number]  Chief Complaint  Patient presents with  . Medication Refill    normodyne    HPI Vicki Jordan is a 45 y.o. female who [facility] medication refill. Pt has HTN that she has used HCTZ and labetalol in the past. Pt was on HCTZ until she thought she could be pregnant and her OB/Gyn switched her to labetalol. Pt would like to switch back to labetalol because she thinks that it did a better job controlling her BP.   Pt has not taken any HTN medication because she ran out 4 days ago.  Pt has been checking her BP has been more elevated at home.   Pt c/o mild frontal HA today. HA is described as a "tightness". Pt denies any congestion or sinus drainage. Pt reports that she has been under increased stress recently. Pt denies any CP, SOB, abd pain, or N/V/D.   8:39 AM At end of visit - patient asked for refill of nicotine patch.  Smoking 1/2 ppd. Used friends nicotine patch earlier this year. Only used 1 patch, did not quit.   Patient Active Problem List   Diagnosis Date Noted  . HTN (hypertension) 04/14/2016   Past Medical History:  Diagnosis Date  . Hypertension    Past Surgical History:  Procedure Laterality Date  . TUBAL LIGATION     Not on File Prior to Admission medications   Medication Sig Start Date End Date Taking? Authorizing Provider  hydrochlorothiazide (MICROZIDE) 12.5 MG capsule Take 1 capsule (12.5 mg total) by mouth daily. Patient not taking: Reported on 07/27/2016 04/14/16   Emi Belfast, FNP  labetalol (NORMODYNE) 200 MG tablet Take 1 tablet (200 mg total) by mouth 2 (two) times daily. Patient not taking:  Reported on 07/27/2016 05/14/14   Peyton Najjar, MD   Social History   Social History  . Marital status: Married    Spouse name: N/A  . Number of children: N/A  . Years of education: N/A   Occupational History  . Not on file.   Social History Main Topics  . Smoking status: Current Every Day Smoker    Packs/day: 0.50    Years: 30.00    Types: Cigarettes  . Smokeless tobacco: Never Used  . Alcohol use No  . Drug use: No  . Sexual activity: No   Other Topics Concern  . Not on file   Social History Narrative  . No narrative on file      Review of Systems  Constitutional: Negative for fever.  HENT: Negative for congestion and sinus pressure.   Respiratory: Negative for cough and shortness of breath.   Cardiovascular: Negative for chest pain.  Gastrointestinal: Negative for abdominal pain, diarrhea, nausea and vomiting.  Neurological: Positive for headaches.       Objective:   Physical Exam  Constitutional: She is oriented to person, place, and time. She appears well-developed and well-nourished. No distress.  HENT:  Head: Normocephalic and atraumatic.  Eyes: Conjunctivae and EOM are normal. Pupils are equal, round, and reactive to light.  Neck: Neck supple. Carotid bruit is not present.  Cardiovascular: Normal rate,  regular rhythm, normal heart sounds and intact distal pulses.   Pulmonary/Chest: Effort normal and breath sounds normal.  Abdominal: Soft. She exhibits no pulsatile midline mass. There is no tenderness.  Musculoskeletal: Normal range of motion.  Neurological: She is alert and oriented to person, place, and time. No cranial nerve deficit or sensory deficit.  No focal neuro deficits.  Skin: Skin is warm and dry.  Psychiatric: She has a normal mood and affect. Her behavior is normal.  Nursing note and vitals reviewed.     Vitals:   07/27/16 0806  BP: 130/80  Pulse: 85  Resp: 17  Temp: 98.2 F (36.8 C)  TempSrc: Oral  SpO2: 97%  Weight: 133 lb  (60.3 kg)  Height: 5\' 3"  (1.6 m)        Assessment & Plan:    Vicki Jordan is a 45 y.o. female Essential hypertension - Plan: hydrochlorothiazide (MICROZIDE) 12.5 MG capsule, Basic metabolic panel  - Borderline, but controlled even off meds for 3 days. Suspect the HCTZ may be working sufficiently and her elevated readings at home may not be accurate. Advised to return with her home meter if she is still having elevated readings.   - continue HCTZ 12.5 mg daily, recheck in 6 months, sooner if worse. BMP pending.  Acute nonintractable headache, unspecified headache type - Plan: acetaminophen (TYLENOL) tablet 500 mg  - Nonfocal neuro exam, may be tension, stress, or borderline elevated BP at home. Tylenol 500 mg given in office, advised to return to clinic or ER if worsening symptoms.  Back abuse.  - patient mentioned at completion of visit that she had tried her friends nicotine patch just once. Has not looked into other options. Still smoking a half pack per day, did not quit. Resources provided through [facility], but advised to return to discuss Ch[facility] that that was something she is interested in.  Meds ordered this encounter  Medications  . acetaminophen (TYLENOL) tablet 500 mg  . hydrochlorothiazide (MICROZIDE) 12.5 MG capsule    Sig: Take 1 capsule (12.5 mg total) by mouth daily.    Dispense:  90 capsule    Refill:  0   Patient Instructions    Blood Pressure reading today was on the higher side of normal, but still normal. Restart the hydrochlorothiazide, 1 pill once per day. If your blood pressures are reading higher at home, bring your machine into the office to make sure you are obtaining accurate readings. Blood pressure should be less than 140/90, ideally 120-130/70-80.  Your headache may be due to tension, stress, or borderline elevated blood pressure. If any worsening of the headache, or not improving with over-the-counter Tylenol on occasion, return to discuss that  further.  Return to the clinic or go to the nearest emergency room if any of your symptoms worsen or new symptoms occur.    Hypertension Hypertension, commonly called high blood pressure, is when the force of blood pumping through your arteries is too strong. Your arteries are the blood vessels that carry blood from your heart throughout your body. A blood pressure reading consists of a higher number over a lower number, such as 110/72. The higher number (systolic) is the pressure inside your arteries when your heart pumps. The lower number (diastolic) is the pressure inside your arteries when your heart relaxes. Ideally you want your blood pressure below 120/80. Hypertension forces your heart to work harder to pump blood. Your arteries may become narrow or stiff. Having untreated or uncontrolled hypertension can  cause heart attack, stroke, kidney disease, and other problems. RISK FACTORS Some risk factors for high blood pressure are controllable. Others are not.  Risk factors you cannot control include:   Race. You may be at higher risk if you are African American.  Age. Risk increases with age.  Gender. Men are at higher risk than women before age 61 years. After age 56, women are at higher risk than men. Risk factors you can control include:  Not getting enough exercise or physical activity.  Being overweight.  Getting too much fat, sugar, calories, or salt in your diet.  Drinking too much alcohol. SIGNS AND SYMPTOMS Hypertension does not usually cause signs or symptoms. Extremely high blood pressure (hypertensive crisis) may cause headache, anxiety, shortness of breath, and nosebleed. DIAGNOSIS To check if you have hypertension, your health care provider will measure your blood pressure while you are seated, with your arm held at the level of your heart. It should be measured at least twice using the same arm. Certain conditions can cause a difference in blood pressure between your  right and left arms. A blood pressure reading that is higher than normal on one occasion does not mean that you need treatment. If it is not clear whether you have high blood pressure, you may be asked to return on a different day to have your blood pressure checked again. Or, you may be asked to monitor your blood pressure at home for 1 or more weeks. TREATMENT Treating high blood pressure includes making lifestyle changes and possibly taking medicine. Living a healthy lifestyle can help lower high blood pressure. You may need to change some of your habits. Lifestyle changes may include:  Following the DASH diet. This diet is high in fruits, vegetables, and whole grains. It is low in salt, red meat, and added sugars.  Keep your sodium intake below 2,300 mg per day.  Getting at least 30-45 minutes of aerobic exercise at least 4 times per week.  Losing weight if necessary.  Not smoking.  Limiting alcoholic beverages.  Learning ways to reduce stress. Your health care provider may prescribe medicine if lifestyle changes are not enough to get your blood pressure under control, and if one of the following is true:  You are 35-81 years of age and your systolic blood pressure is above 140.  You are 33 years of age or older, and your systolic blood pressure is above 150.  Your diastolic blood pressure is above 90.  You have diabetes, and your systolic blood pressure is over 140 or your diastolic blood pressure is over 90.  You have kidney disease and your blood pressure is above 140/90.  You have heart disease and your blood pressure is above 140/90. Your personal target blood pressure may vary depending on your medical conditions, your age, and other factors. HOME CARE INSTRUCTIONS  Have your blood pressure rechecked as directed by your health care provider.   Take medicines only as directed by your health care provider. Follow the directions carefully. Blood pressure medicines must be  taken as prescribed. The medicine does not work as well when you skip doses. Skipping doses also puts you at risk for problems.  Do not smoke.   Monitor your blood pressure at home as directed by your health care provider. SEEK MEDICAL CARE IF:   You think you are having a reaction to medicines taken.  You have recurrent headaches or feel dizzy.  You have swelling in your ankles.  You  have trouble with your vision. SEEK IMMEDIATE MEDICAL CARE IF:  You develop a severe headache or confusion.  You have unusual weakness, numbness, or feel faint.  You have severe chest or abdominal pain.  You vomit repeatedly.  You have trouble breathing. MAKE SURE YOU:   Understand these instructions.  Will watch your condition.  Will get help right away if you are not doing well or get worse.   This information is not intended to replace advice given to you by your health care provider. Make sure you discuss any questions you have with your health care provider.   Document Released: 10/05/2005 Document Revised: 02/19/2015 Document Reviewed: 07/28/2013 Elsevier Interactive Patient Education 2016 [employer].   IF you received an x-ray today, you will receive an invoice from [facility]. Please contact [facility] at [phone] with questions or concerns regarding your invoice.   IF you received labwork today, you will receive an invoice from [facility]. Please contact [facility] at [phone] with questions or concerns regarding your invoice.   Our billing staff will not be able to assist you with questions regarding bills from these companies.  You will be contacted with the lab results as soon as they are available. The fastest way to get your results is to activate your My Chart account. Instructions are located on the last page of this paperwork. If you have not heard from [place] regarding the results in 2 weeks, please contact this  office.        I personally performed the services described in this documentation, which was scribed in my presence. The recorded information has been reviewed and considered, and addended by me as needed.   Signed,   Meredith Staggers, MD [facility].  07/27/16 8:31 AM

## 2016-07-27 NOTE — Patient Instructions (Addendum)
Blood Pressure reading today was on the higher side of normal, but still normal. Restart the hydrochlorothiazide, 1 pill once per day. If your blood pressures are reading higher at home, bring your machine into the office to make sure you are obtaining accurate readings. Blood pressure should be less than 140/90, ideally 120-130/70-80.  Your headache may be due to tension, stress, or borderline elevated blood pressure. If any worsening of the headache, or not improving with over-the-counter Tylenol on occasion, return to discuss that further.  Return to the clinic or go to the nearest emergency room if any of your symptoms worsen or new symptoms occur.  [facility] offers smoking cessation clinics. Registration is required. To register call [phone] or register online at [email or web address]. If you want to use the patches, those can be purchased over the counter.  Chantix is a prescription medicine to help quit smoking.  Return to discuss that medication if that is something you would like to try.   Smoking Cessation, Tips for Success If you are ready to quit smoking, congratulations! You have chosen to help yourself be healthier. Cigarettes bring nicotine, tar, carbon monoxide, and other irritants into your body. Your lungs, heart, and blood vessels will be able to work better without these poisons. There are many different ways to quit smoking. Nicotine gum, nicotine patches, a nicotine inhaler, or nicotine nasal spray can help with physical craving. Hypnosis, support groups, and medicines help break the habit of smoking. WHAT THINGS CAN I DO TO MAKE QUITTING EASIER?  Here are some tips to help you quit for good:  Pick a date when you will quit smoking completely. Tell all of your friends and family about your plan to quit on that date.  Do not try to slowly cut down on the number of cigarettes you are smoking. Pick a quit date and quit smoking completely starting on that day.  Throw away all  cigarettes.   Clean and remove all ashtrays from your home, work, and car.  On a card, write down your reasons for quitting. Carry the card with you and read it when you get the urge to smoke.  Cleanse your body of nicotine. Drink enough water and fluids to keep your urine clear or pale yellow. Do this after quitting to flush the nicotine from your body.  Learn to predict your moods. Do not let a bad situation be your excuse to have a cigarette. Some situations in your life might tempt you into wanting a cigarette.  Never have "just one" cigarette. It leads to wanting another and another. Remind yourself of your decision to quit.  Change habits associated with smoking. If you smoked while driving or when feeling stressed, try other activities to replace smoking. Stand up when drinking your coffee. Brush your teeth after eating. Sit in a different chair when you read the paper. Avoid alcohol while trying to quit, and try to drink fewer caffeinated beverages. Alcohol and caffeine may urge you to smoke.  Avoid foods and drinks that can trigger a desire to smoke, such as sugary or spicy foods and alcohol.  Ask people who smoke not to smoke around you.  Have something planned to do right after eating or having a cup of coffee. For example, plan to take a walk or exercise.  Try a relaxation exercise to calm you down and decrease your stress. Remember, you may be tense and nervous for the first 2 weeks after you quit, but this will pass.  Find new activities to keep your hands busy. Play with a pen, coin, or rubber band. Doodle or draw things on paper.  Brush your teeth right after eating. This will help cut down on the craving for the taste of tobacco after meals. You can also try mouthwash.   Use oral substitutes in place of cigarettes. Try using lemon drops, carrots, cinnamon sticks, or chewing gum. Keep them handy so they are available when you have the urge to smoke.  When you have the  urge to smoke, try deep breathing.  Designate your home as a nonsmoking area.  If you are a heavy smoker, ask your health care provider about a prescription for nicotine chewing gum. It can ease your withdrawal from nicotine.  Reward yourself. Set aside the cigarette money you save and buy yourself something nice.  Look for support from others. Join a support group or smoking cessation program. Ask someone at home or at work to help you with your plan to quit smoking.  Always ask yourself, "Do I need this cigarette or is this just a reflex?" Tell yourself, "Today, I choose not to smoke," or "I do not want to smoke." You are reminding yourself of your decision to quit.  Do not replace cigarette smoking with electronic cigarettes (commonly called e-cigarettes). The safety of e-cigarettes is unknown, and some may contain harmful chemicals.  If you relapse, do not give up! Plan ahead and think about what you will do the next time you get the urge to smoke. HOW WILL I FEEL WHEN I QUIT SMOKING? You may have symptoms of withdrawal because your body is used to nicotine (the addictive substance in cigarettes). You may crave cigarettes, be irritable, feel very hungry, cough often, get headaches, or have difficulty concentrating. The withdrawal symptoms are only temporary. They are strongest when you first quit but will go away within 10-14 days. When withdrawal symptoms occur, stay in control. Think about your reasons for quitting. Remind yourself that these are signs that your body is healing and getting used to being without cigarettes. Remember that withdrawal symptoms are easier to treat than the major diseases that smoking can cause.  Even after the withdrawal is over, expect periodic urges to smoke. However, these cravings are generally short lived and will go away whether you smoke or not. Do not smoke! WHAT RESOURCES ARE AVAILABLE TO HELP ME QUIT SMOKING? Your health care provider can direct you to  community resources or hospitals for support, which may include:  Group support.  Education.  Hypnosis.  Therapy.   This information is not intended to replace advice given to you by your health care provider. Make sure you discuss any questions you have with your health care provider.   Document Released: 07/03/2004 Document Revised: 10/26/2014 Document Reviewed: 03/23/2013 Elsevier Interactive Patient Education 2016 [employer].     Hypertension Hypertension, commonly called high blood pressure, is when the force of blood pumping through your arteries is too strong. Your arteries are the blood vessels that carry blood from your heart throughout your body. A blood pressure reading consists of a higher number over a lower number, such as 110/72. The higher number (systolic) is the pressure inside your arteries when your heart pumps. The lower number (diastolic) is the pressure inside your arteries when your heart relaxes. Ideally you want your blood pressure below 120/80. Hypertension forces your heart to work harder to pump blood. Your arteries may become narrow or stiff. Having untreated or uncontrolled  hypertension can cause heart attack, stroke, kidney disease, and other problems. RISK FACTORS Some risk factors for high blood pressure are controllable. Others are not.  Risk factors you cannot control include:   Race. You may be at higher risk if you are African American.  Age. Risk increases with age.  Gender. Men are at higher risk than women before age 27 years. After age 74, women are at higher risk than men. Risk factors you can control include:  Not getting enough exercise or physical activity.  Being overweight.  Getting too much fat, sugar, calories, or salt in your diet.  Drinking too much alcohol. SIGNS AND SYMPTOMS Hypertension does not usually cause signs or symptoms. Extremely high blood pressure (hypertensive crisis) may cause headache, anxiety, shortness of  breath, and nosebleed. DIAGNOSIS To check if you have hypertension, your health care provider will measure your blood pressure while you are seated, with your arm held at the level of your heart. It should be measured at least twice using the same arm. Certain conditions can cause a difference in blood pressure between your right and left arms. A blood pressure reading that is higher than normal on one occasion does not mean that you need treatment. If it is not clear whether you have high blood pressure, you may be asked to return on a different day to have your blood pressure checked again. Or, you may be asked to monitor your blood pressure at home for 1 or more weeks. TREATMENT Treating high blood pressure includes making lifestyle changes and possibly taking medicine. Living a healthy lifestyle can help lower high blood pressure. You may need to change some of your habits. Lifestyle changes may include:  Following the DASH diet. This diet is high in fruits, vegetables, and whole grains. It is low in salt, red meat, and added sugars.  Keep your sodium intake below 2,300 mg per day.  Getting at least 30-45 minutes of aerobic exercise at least 4 times per week.  Losing weight if necessary.  Not smoking.  Limiting alcoholic beverages.  Learning ways to reduce stress. Your health care provider may prescribe medicine if lifestyle changes are not enough to get your blood pressure under control, and if one of the following is true:  You are 15-2 years of age and your systolic blood pressure is above 140.  You are 5 years of age or older, and your systolic blood pressure is above 150.  Your diastolic blood pressure is above 90.  You have diabetes, and your systolic blood pressure is over 140 or your diastolic blood pressure is over 90.  You have kidney disease and your blood pressure is above 140/90.  You have heart disease and your blood pressure is above 140/90. Your personal target  blood pressure may vary depending on your medical conditions, your age, and other factors. HOME CARE INSTRUCTIONS  Have your blood pressure rechecked as directed by your health care provider.   Take medicines only as directed by your health care provider. Follow the directions carefully. Blood pressure medicines must be taken as prescribed. The medicine does not work as well when you skip doses. Skipping doses also puts you at risk for problems.  Do not smoke.   Monitor your blood pressure at home as directed by your health care provider. SEEK MEDICAL CARE IF:   You think you are having a reaction to medicines taken.  You have recurrent headaches or feel dizzy.  You have swelling in your ankles.  You have trouble with your vision. SEEK IMMEDIATE MEDICAL CARE IF:  You develop a severe headache or confusion.  You have unusual weakness, numbness, or feel faint.  You have severe chest or abdominal pain.  You vomit repeatedly.  You have trouble breathing. MAKE SURE YOU:   Understand these instructions.  Will watch your condition.  Will get help right away if you are not doing well or get worse.   This information is not intended to replace advice given to you by your health care provider. Make sure you discuss any questions you have with your health care provider.   Document Released: 10/05/2005 Document Revised: 02/19/2015 Document Reviewed: 07/28/2013 Elsevier Interactive Patient Education 2016 [employer].   IF you received an x-ray today, you will receive an invoice from [facility]. Please contact [facility] at [phone] with questions or concerns regarding your invoice.   IF you received labwork today, you will receive an invoice from [employer]. Please contact [facility] at [phone] with questions or concerns regarding your invoice.   Our billing staff will not be able to assist you with questions regarding  bills from these companies.  You will be contacted with the lab results as soon as they are available. The fastest way to get your results is to activate your My Chart account. Instructions are located on the last page of this paperwork. If you have not heard from [place] regarding the results in 2 weeks, please contact this office.

## 2016-07-29 NOTE — Telephone Encounter (Signed)
[facility]

## 2017-03-04 ENCOUNTER — Other Ambulatory Visit: Payer: Self-pay | Admitting: Family Medicine

## 2017-03-04 DIAGNOSIS — I10 Essential (primary) hypertension: Secondary | ICD-10-CM

## 2018-06-08 ENCOUNTER — Other Ambulatory Visit: Payer: Self-pay | Admitting: Family Medicine

## 2018-06-08 DIAGNOSIS — I10 Essential (primary) hypertension: Secondary | ICD-10-CM

## 2018-08-13 ENCOUNTER — Other Ambulatory Visit: Payer: Self-pay

## 2018-08-13 ENCOUNTER — Encounter (HOSPITAL_COMMUNITY): Payer: Self-pay | Admitting: Family Medicine

## 2018-08-13 ENCOUNTER — Ambulatory Visit (HOSPITAL_COMMUNITY)
Admission: EM | Admit: 2018-08-13 | Discharge: 2018-08-13 | Disposition: A | Discharge status: Home / Self Care | Payer: Self-pay | Attending: Family Medicine | Admitting: Family Medicine

## 2018-08-13 DIAGNOSIS — Z760 Encounter for issue of repeat prescription: Secondary | ICD-10-CM

## 2018-08-13 DIAGNOSIS — I10 Essential (primary) hypertension: Secondary | ICD-10-CM

## 2018-08-13 MED ORDER — LOSARTAN POTASSIUM-HCTZ 50-12.5 MG PO TABS: 1.0000 | ORAL_TABLET | Freq: Every day | ORAL | 2 refills | Status: DC

## 2018-08-13 NOTE — ED Triage Notes (Signed)
Blood pressure medicine refill, out for 2 days

## 2018-08-15 NOTE — ED Provider Notes (Signed)
[facility]   [id number] 08/13/18 Arrival Time: 1713  ASSESSMENT & PLAN:  1. Medication refill   2. Essential hypertension     Meds ordered this encounter  Medications  . losartan-hydrochlorothiazide (HYZAAR) 50-12.5 MG tablet    Sig: Take 1 tablet by mouth daily.    Dispense:  30 tablet    Refill:  2   Encouraged her to re-establish with her PCP for further refills.  Reviewed expectations re: course of current medical issues. Questions answered. Outlined signs and symptoms indicating need for more acute intervention. Patient verbalized understanding. After Visit Summary given.   SUBJECTIVE: History from: patient. Vicki Jordan is a 47 y.o. female who presents requesting medication refill. HTN; controlled. No current concerns. Has been out of medicine for two days. Has been awhile since she has seen her PCP. No recent SOB/CP/LE edema. No vision changes. No headaches. Weight stable. Taking medications as directed.   Continues to smoke. Social History   Tobacco Use  Smoking Status Current Every Day Smoker  . Packs/day: 0.50  . Years: 30.00  . Pack years: 15.00  . Types: Cigarettes  Smokeless Tobacco Never Used   Current medical problems include: Past Medical History:  Diagnosis Date  . Hypertension     No current facility-administered medications for this encounter.   Current Outpatient Medications:  .  losartan-hydrochlorothiazide (HYZAAR) 50-12.5 MG tablet, Take 1 tablet by mouth daily., Disp: 30 tablet, Rfl: 2  ROS: As per HPI.   OBJECTIVE:  Vitals:   08/13/18 1754  BP: 135/80  Pulse: 79  Resp: 16  Temp: 98.3 F (36.8 C)  TempSrc: Oral  SpO2: 100%    General appearance: alert; no distress Lungs: clear to auscultation bilaterally Heart: regular rate and rhythm Extremities: no cyanosis or edema; symmetrical with no gross deformities Skin: warm and dry Psychological: alert and cooperative; normal mood and affect  Labs: Results for  orders placed or performed in visit on 07/27/16  Basic metabolic panel  Result Value Ref Range   Sodium 140 135 - 146 mmol/L   Potassium 3.9 3.5 - 5.3 mmol/L   Chloride 105 98 - 110 mmol/L   CO2 23 20 - 31 mmol/L   Glucose, Bld 86 65 - 99 mg/dL   BUN 11 7 - 25 mg/dL   Creat 8.41 3.24 - 4.01 mg/dL   Calcium 9.3 8.6 - 02.7 mg/dL   No Known Allergies  Social History   Socioeconomic History  . Marital status: Married    Spouse name: Not on file  . Number of children: Not on file  . Years of education: Not on file  . Highest education level: Not on file  Occupational History  . Not on file  Social Needs  . Financial resource strain: Not on file  . Food insecurity:    Worry: Not on file    Inability: Not on file  . Transportation needs:    Medical: Not on file    Non-medical: Not on file  Tobacco Use  . Smoking status: Current Every Day Smoker    Packs/day: 0.50    Years: 30.00    Pack years: 15.00    Types: Cigarettes  . Smokeless tobacco: Never Used  Substance and Sexual Activity  . Alcohol use: No    Alcohol/week: 0.0 standard drinks  . Drug use: No  . Sexual activity: Never  Lifestyle  . Physical activity:    Days per week: Not on file    Minutes per session:  Not on file  . Stress: Not on file  Relationships  . Social connections:    Talks on phone: Not on file    Gets together: Not on file    Attends religious service: Not on file    Active member of club or organization: Not on file    Attends meetings of clubs or organizations: Not on file    Relationship status: Not on file  . Intimate partner violence:    Fear of current or ex partner: Not on file    Emotionally abused: Not on file    Physically abused: Not on file    Forced sexual activity: Not on file  Other Topics Concern  . Not on file  Social History Narrative  . Not on file   History reviewed. No pertinent family history. Past Surgical History:  Procedure Laterality Date  . TUBAL LIGATION        Mardella Layman, MD 08/15/18 0900

## 2020-05-12 ENCOUNTER — Other Ambulatory Visit: Payer: Self-pay

## 2020-05-12 ENCOUNTER — Ambulatory Visit (HOSPITAL_COMMUNITY)
Admission: EM | Admit: 2020-05-12 | Discharge: 2020-05-12 | Disposition: A | Discharge status: Home / Self Care | Payer: Self-pay | Attending: Emergency Medicine | Admitting: Emergency Medicine

## 2020-05-12 ENCOUNTER — Encounter (HOSPITAL_COMMUNITY): Payer: Self-pay

## 2020-05-12 DIAGNOSIS — L03011 Cellulitis of right finger: Secondary | ICD-10-CM

## 2020-05-12 MED ORDER — MUPIROCIN 2 % EX OINT: 1.0000 "application " | TOPICAL_OINTMENT | Freq: Two times a day (BID) | CUTANEOUS | 0 refills | Status: DC

## 2020-05-12 MED ORDER — IBUPROFEN 800 MG PO TABS: 800.0000 mg | ORAL_TABLET | Freq: Three times a day (TID) | ORAL | 0 refills | Status: DC

## 2020-05-12 MED ORDER — CLINDAMYCIN HCL 300 MG PO CAPS: 300.0000 mg | ORAL_CAPSULE | Freq: Three times a day (TID) | ORAL | 0 refills | Status: AC

## 2020-05-12 NOTE — Discharge Instructions (Signed)
Begin clindamycin every 8 hours for 1 week to treat infection and fingers May continue soaking in warm water, dry well and apply Bactroban afterward twice daily Use anti-inflammatories for pain/swelling. You may take up to 800 mg Ibuprofen every 8 hours with food. You may supplement Ibuprofen with Tylenol [phone] mg every 8 hours.  If any symptoms not improving or worsening please follow-up here or emergency room

## 2020-05-12 NOTE — ED Triage Notes (Signed)
Pt presents to UC for possible allergic reaction on left thumb and index finger, and right middle finger. 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 Pt has been treating with aloe, soaking with salt water, and applying bleach to affected areas. RN instructed pt to refrain from bleach application.

## 2020-05-13 NOTE — ED Provider Notes (Signed)
[facility]    CSN: [id number] Arrival date & time: 05/12/20  1449      History   Chief Complaint Chief Complaint  Patient presents with  . Allergic Reaction    HPI Vicki Jordan is a 49 y.o. female presenting today for evaluation of possible infection or allergic reaction to fingers.  Patient reports that approximately 2 weeks ago she began using a new nail product, stopped using the nail products since that she has developed some cracking to her fingers.  Most notable on her right thumb and index finger and left middle finger.  She has had associated pain with this.  She denies any drainage.  She has been applying bleach aloe, moisturizers as well as soaking in salt water without relief.  In the past 24 hours she has developed increased redness pain and swelling to her right thumb.  HPI  Past Medical History:  Diagnosis Date  . Hypertension     Patient Active Problem List   Diagnosis Date Noted  . HTN (hypertension) 04/14/2016    Past Surgical History:  Procedure Laterality Date  . TUBAL LIGATION      OB History   No obstetric history on file.      Home Medications    Prior to Admission medications   Medication Sig Start Date End Date Taking? Authorizing Provider  clindamycin (CLEOCIN) 300 MG capsule Take 1 capsule (300 mg total) by mouth 3 (three) times daily for 7 days. 05/12/20 05/19/20  Zipporah Finamore C, PA-C  ibuprofen (ADVIL) 800 MG tablet Take 1 tablet (800 mg total) by mouth 3 (three) times daily. 05/12/20   Kaula Klenke C, PA-C  losartan-hydrochlorothiazide (HYZAAR) 50-12.5 MG tablet Take 1 tablet by mouth daily. Patient not taking: Reported on 05/12/2020 08/13/18   Mardella Layman, MD  mupirocin ointment (BACTROBAN) 2 % Apply 1 application topically 2 (two) times daily. 05/12/20   Enyla Lisbon, Junius Creamer, PA-C    Family History History reviewed. No pertinent family history.  Social History Social History   Tobacco Use  . Smoking status: Current  Every Day Smoker    Packs/day: 0.50    Years: 30.00    Pack years: 15.00    Types: Cigarettes  . Smokeless tobacco: Never Used  Substance Use Topics  . Alcohol use: No    Alcohol/week: 0.0 standard drinks  . Drug use: No     Allergies   Patient has no known allergies.   Review of Systems Review of Systems  Constitutional: Negative for fatigue and fever.  HENT: Negative for mouth sores.   Eyes: Negative for visual disturbance.  Respiratory: Negative for shortness of breath.   Cardiovascular: Negative for chest pain.  Gastrointestinal: Negative for abdominal pain, nausea and vomiting.  Genitourinary: Negative for genital sores.  Musculoskeletal: Negative for arthralgias and joint swelling.  Skin: Positive for color change and rash. Negative for wound.  Neurological: Negative for dizziness, weakness, light-headedness and headaches.     Physical Exam Triage Vital Signs ED Triage Vitals  Enc Vitals Group     BP 05/12/20 1550 (!) 162/93     Pulse Rate 05/12/20 1550 88     Resp 05/12/20 1550 16     Temp 05/12/20 1550 98.3 F (36.8 C)     Temp Source 05/12/20 1550 Oral     SpO2 05/12/20 1550 100 %     Weight --      Height --      Head Circumference --  Peak Flow --      Pain Score 05/12/20 1548 5     Pain Loc --      Pain Edu? --      Excl. in GC? --    No data found.  Updated Vital Signs BP (!) 162/93 (BP Location: Right Arm) Comment: pt off antihypertensives  Pulse 88   Temp 98.3 F (36.8 C) (Oral)   Resp 16   SpO2 100%   Visual Acuity Right Eye Distance:   Left Eye Distance:   Bilateral Distance:    Right Eye Near:   Left Eye Near:    Bilateral Near:     Physical Exam Vitals and nursing note reviewed.  Constitutional:      Appearance: She is well-developed.     Comments: No acute distress  HENT:     Head: Normocephalic and atraumatic.     Nose: Nose normal.  Eyes:     Conjunctiva/sclera: Conjunctivae normal.  Cardiovascular:     Rate  and Rhythm: Normal rate.  Pulmonary:     Effort: Pulmonary effort is normal. No respiratory distress.  Abdominal:     General: There is no distension.  Musculoskeletal:        General: Normal range of motion.     Cervical back: Neck supple.     Comments: Full active range of motion at the interphalangeal joint of right thumb  Skin:    General: Skin is warm and dry.     Comments: Distal tips of right thumb and index finger and left middle finger with cracking and dryness, right thumb with erythema noted within distal pulp, no area of fluctuance or pus noted  Neurological:     Mental Status: She is alert and oriented to person, place, and time.      UC Treatments / Results  Labs (all labs ordered are listed, but only abnormal results are displayed) Labs Reviewed - No data to display  EKG   Radiology No results found.  Procedures Procedures (including critical care time)  Medications Ordered in UC Medications - No data to display  Initial Impression / Assessment and Plan / UC Course  I have reviewed the triage vital signs and the nursing notes.  Pertinent labs & imaging results that were available during my care of the patient were reviewed by me and considered in my medical decision making (see chart for details).     Patient does appear to have cracking/drying to fingertips, possible relation to a new product, but of bigger concern may have developed cellulitis/infection to her right thumb secondary to this.  Concerning for early felon, no source of pus at this time to open and drain.  Initiating on clindamycin, may continue soaking and applying Bactroban to cracking.  Monitor for resolution of redness swelling and pain, if cracking persisting may return for reevaluation.  If pain redness and swelling worsening to follow-up in emergency room.  Discussed strict return precautions. Patient verbalized understanding and is agreeable with plan.  Final Clinical Impressions(s) /  UC Diagnoses   Final diagnoses:  Cellulitis of right thumb     Discharge Instructions     Begin clindamycin every 8 hours for 1 week to treat infection and fingers May continue soaking in warm water, dry well and apply Bactroban afterward twice daily Use anti-inflammatories for pain/swelling. You may take up to 800 mg Ibuprofen every 8 hours with food. You may supplement Ibuprofen with Tylenol [phone] mg every 8 hours.  If any  symptoms not improving or worsening please follow-up here or emergency room   ED Prescriptions    Medication Sig Dispense Auth. Provider   clindamycin (CLEOCIN) 300 MG capsule Take 1 capsule (300 mg total) by mouth 3 (three) times daily for 7 days. 21 capsule Janziel Hockett C, PA-C   mupirocin ointment (BACTROBAN) 2 % Apply 1 application topically 2 (two) times daily. 30 g Merland Holness C, PA-C   ibuprofen (ADVIL) 800 MG tablet Take 1 tablet (800 mg total) by mouth 3 (three) times daily. 21 tablet Zofia Peckinpaugh, [place] C, PA-C     PDMP not reviewed this encounter.   Lew Dawes, [place] 05/13/20 1412

## 2020-05-15 ENCOUNTER — Ambulatory Visit (HOSPITAL_COMMUNITY)
Admission: EM | Admit: 2020-05-15 | Discharge: 2020-05-15 | Disposition: A | Discharge status: Home / Self Care | Payer: Self-pay | Attending: Family Medicine | Admitting: Family Medicine

## 2020-05-15 ENCOUNTER — Encounter (HOSPITAL_COMMUNITY): Payer: Self-pay

## 2020-05-15 DIAGNOSIS — L03019 Cellulitis of unspecified finger: Secondary | ICD-10-CM

## 2020-05-15 NOTE — Discharge Instructions (Addendum)
We drained the finger.  Keep wrapped.  Finish the antibiotics.  Ibuprofen as needed.  Recommend Eucerin emollient over the counter to moisturize the hands.

## 2020-05-15 NOTE — ED Provider Notes (Addendum)
[facility]    CSN: [id number] Arrival date & time: 05/15/20  1018      History   Chief Complaint Chief Complaint  Patient presents with  . Finger Injury    HPI Vicki Jordan is a 49 y.o. female.   Patient is a 49 year old female who presents today with right thumb swelling, discolored area to right thumb.  Was seen here a few days ago and started on antibiotics for infection.  Feels like it is more swollen and needs to be drained.  Denies any fevers, chills.     Past Medical History:  Diagnosis Date  . Hypertension     Patient Active Problem List   Diagnosis Date Noted  . HTN (hypertension) 04/14/2016    Past Surgical History:  Procedure Laterality Date  . TUBAL LIGATION      OB History   No obstetric history on file.      Home Medications    Prior to Admission medications   Medication Sig Start Date End Date Taking? Authorizing Provider  clindamycin (CLEOCIN) 300 MG capsule Take 1 capsule (300 mg total) by mouth 3 (three) times daily for 7 days. 05/12/20 05/19/20  Wieters, Hallie C, PA-C  ibuprofen (ADVIL) 800 MG tablet Take 1 tablet (800 mg total) by mouth 3 (three) times daily. 05/12/20   Wieters, Hallie C, PA-C  losartan-hydrochlorothiazide (HYZAAR) 50-12.5 MG tablet Take 1 tablet by mouth daily. Patient not taking: Reported on 05/12/2020 08/13/18   Mardella Layman, MD  mupirocin ointment (BACTROBAN) 2 % Apply 1 application topically 2 (two) times daily. 05/12/20   Wieters, Junius Creamer, PA-C    Family History History reviewed. No pertinent family history.  Social History Social History   Tobacco Use  . Smoking status: Current Every Day Smoker    Packs/day: 0.50    Years: 30.00    Pack years: 15.00    Types: Cigarettes  . Smokeless tobacco: Never Used  Substance Use Topics  . Alcohol use: No    Alcohol/week: 0.0 standard drinks  . Drug use: No     Allergies   Patient has no known allergies.   Review of Systems Review of  Systems   Physical Exam Triage Vital Signs ED Triage Vitals  Enc Vitals Group     BP 05/15/20 1115 (!) 148/96     Pulse Rate 05/15/20 1115 73     Resp 05/15/20 1115 19     Temp 05/15/20 1115 98.3 F (36.8 C)     Temp src --      SpO2 05/15/20 1115 97 %     Weight --      Height --      Head Circumference --      Peak Flow --      Pain Score 05/15/20 1113 3     Pain Loc --      Pain Edu? --      Excl. in GC? --    No data found.  Updated Vital Signs BP (!) 148/96   Pulse 73   Temp 98.3 F (36.8 C)   Resp 19   LMP 07/08/2016 (Approximate)   SpO2 97%   Visual Acuity Right Eye Distance:   Left Eye Distance:   Bilateral Distance:    Right Eye Near:   Left Eye Near:    Bilateral Near:     Physical Exam Vitals and nursing note reviewed.  Constitutional:      General: She is not in acute distress.  Appearance: Normal appearance. She is not ill-appearing, toxic-appearing or diaphoretic.  HENT:     Head: Normocephalic.     Nose: Nose normal.  Eyes:     Conjunctiva/sclera: Conjunctivae normal.  Pulmonary:     Effort: Pulmonary effort is normal.  Musculoskeletal:        General: Normal range of motion.       Hands:     Cervical back: Normal range of motion.  Skin:    General: Skin is warm and dry.     Findings: No rash.  Neurological:     Mental Status: She is alert.  Psychiatric:        Mood and Affect: Mood normal.      UC Treatments / Results  Labs (all labs ordered are listed, but only abnormal results are displayed) Labs Reviewed - No data to display  EKG   Radiology No results found.  Procedures Incision and Drainage  Date/Time: 05/15/2020 11:47 AM Performed by: Janace Aris, NP Authorized by: Janace Aris, NP   Consent:    Consent obtained:  Verbal   Consent given by:  Patient   Risks discussed:  Bleeding and incomplete drainage Location:    Indications for incision and drainage: felon.   Location:  Upper extremity   Upper  extremity location:  Finger   Finger location:  R thumb Anesthesia (see MAR for exact dosages):    Anesthesia method:  Topical application Procedure type:    Complexity:  Simple Procedure details:    Needle aspiration: no     Incision types:  Stab incision   Scalpel blade:  11   Drainage:  Bloody and purulent   Drainage amount:  Moderate   Wound treatment:  Wound left open   Packing materials:  None Post-procedure details:    Patient tolerance of procedure:  Tolerated well, no immediate complications   (including critical care time)  Medications Ordered in UC Medications - No data to display  Initial Impression / Assessment and Plan / UC Course  I have reviewed the triage vital signs and the nursing notes.  Pertinent labs & imaging results that were available during my care of the patient were reviewed by me and considered in my medical decision making (see chart for details).     Felon of right thumb I&D done here in clinic. Patient tolerated well.  Placed antibiotic ointment and wrapped.  Recommend continue the clindamycin. Continue to massage today to promote more drainage Follow up as needed for continued or worsening symptoms  Final Clinical Impressions(s) / UC Diagnoses   Final diagnoses:  Felon of finger     Discharge Instructions     We drained the finger.  Keep wrapped.  Finish the antibiotics.  Ibuprofen as needed.  Recommend Eucerin emollient over the counter to moisturize the hands.     ED Prescriptions    None     PDMP not reviewed this encounter.   Janace Aris, NP 05/15/20 1146    Dahlia Byes A, NP 05/15/20 1149

## 2020-05-15 NOTE — ED Triage Notes (Signed)
Pt presents with complaints of follow up for wound in right thumb. Reports that she feels like she has an area that needs to be drained. Reports area is worse than it was when she was here earlier in the week. She is being treated for a possible infection to her finger.

## 2020-05-17 ENCOUNTER — Encounter (HOSPITAL_COMMUNITY): Payer: Self-pay

## 2020-05-17 ENCOUNTER — Ambulatory Visit (HOSPITAL_COMMUNITY)
Admission: EM | Admit: 2020-05-17 | Discharge: 2020-05-17 | Disposition: A | Discharge status: Home / Self Care | Payer: Self-pay | Attending: Physician Assistant | Admitting: Physician Assistant

## 2020-05-17 ENCOUNTER — Other Ambulatory Visit: Payer: Self-pay

## 2020-05-17 DIAGNOSIS — R519 Headache, unspecified: Secondary | ICD-10-CM | POA: Insufficient documentation

## 2020-05-17 DIAGNOSIS — I10 Essential (primary) hypertension: Secondary | ICD-10-CM | POA: Insufficient documentation

## 2020-05-17 LAB — BASIC METABOLIC PANEL
Anion gap: 10 (ref 5–15)
BUN: 7 mg/dL (ref 6–20)
CO2: 25 mmol/L (ref 22–32)
Calcium: 9.4 mg/dL (ref 8.9–10.3)
Chloride: 101 mmol/L (ref 98–111)
Creatinine, Ser: 0.72 mg/dL (ref 0.44–1.00)
GFR calc Af Amer: >60 mL/min (ref >60)
GFR calc non Af Amer: >60 mL/min (ref >60)
Glucose, Bld: 93 mg/dL (ref 70–99)
Potassium: 3.2 mmol/L — ABNORMAL LOW (ref 3.5–5.1)
Sodium: 136 mmol/L (ref 135–145)

## 2020-05-17 MED ORDER — HYDROCHLOROTHIAZIDE 25 MG PO TABS: 25.0000 mg | ORAL_TABLET | Freq: Every day | ORAL | 0 refills | Status: DC

## 2020-05-17 MED ORDER — ACETAMINOPHEN 325 MG PO TABS
650.0000 mg | ORAL_TABLET | Freq: Four times a day (QID) | ORAL | 0 refills | Status: DC | PRN
Start: 2020-05-17 — End: 2020-08-12

## 2020-05-17 NOTE — Discharge Instructions (Addendum)
Start the medicine daily Call the internal medicine center to get a primary doctor and follow up for blood pressure management  Take tylenol for the headache  I sent some basic labs, if these are ok you will not hear back from me, if we need to discuss I will call  If headache gets more severe, you have dizzines, vomiting, blurred vision, chest pain or shortness of breath, go to the emergency department

## 2020-05-17 NOTE — ED Triage Notes (Addendum)
Pt c/o headache since last night, Aleve taken this AM without improvement. Pt is concerned her blood pressure is high, has been out of BP meds x 1 month

## 2020-05-17 NOTE — ED Provider Notes (Signed)
[facility]    CSN: [id number] Arrival date & time: 05/17/20  1937      History   Chief Complaint Chief Complaint  Patient presents with  . Headache    HPI Vicki Jordan is a 49 y.o. female.   Patient presents for evaluation of headache and concerned about her blood pressure.  She reports she was previously on blood pressure medicine but has been out of this for a long time.  She does report she was taking the medicine previously prescribed on and off as she was running out.  She is unsure of when the last dose was.  She reports she developed a headache yesterday evening and states that this is a headache that she has had with high blood pressure before.  She reports she has tried some Aleve for the headache and it did not help much.  She reports headache is persisted through today.  She denies dizziness, weakness, numbness or vision changes.  Denies chest pain or shortness of breath.  She does not check her blood pressure at home.  She has not seen primary care in several years.      Past Medical History:  Diagnosis Date  . Hypertension     Patient Active Problem List   Diagnosis Date Noted  . HTN (hypertension) 04/14/2016    Past Surgical History:  Procedure Laterality Date  . TUBAL LIGATION      OB History   No obstetric history on file.      Home Medications    Prior to Admission medications   Medication Sig Start Date End Date Taking? Authorizing Provider  acetaminophen (TYLENOL) 325 MG tablet Take 2 tablets (650 mg total) by mouth every 6 (six) hours as needed. 05/17/20   Tal Neer, Veryl Speak, PA-C  clindamycin (CLEOCIN) 300 MG capsule Take 1 capsule (300 mg total) by mouth 3 (three) times daily for 7 days. 05/12/20 05/19/20  Wieters, Hallie C, PA-C  hydrochlorothiazide (HYDRODIURIL) 25 MG tablet Take 1 tablet (25 mg total) by mouth daily. 05/17/20 06/16/20  Joie Reamer, Veryl Speak, PA-C  ibuprofen (ADVIL) 800 MG tablet Take 1 tablet (800 mg total) by mouth 3 (three)  times daily. 05/12/20   Wieters, Hallie C, PA-C  mupirocin ointment (BACTROBAN) 2 % Apply 1 application topically 2 (two) times daily. 05/12/20   Wieters, Hallie C, PA-C  losartan-hydrochlorothiazide (HYZAAR) 50-12.5 MG tablet Take 1 tablet by mouth daily. Patient not taking: Reported on 05/12/2020 08/13/18 05/17/20  Mardella Layman, MD    Family History History reviewed. No pertinent family history.  Social History Social History   Tobacco Use  . Smoking status: Current Every Day Smoker    Packs/day: 0.50    Years: 30.00    Pack years: 15.00    Types: Cigarettes  . Smokeless tobacco: Never Used  Substance Use Topics  . Alcohol use: No    Alcohol/week: 0.0 standard drinks  . Drug use: No     Allergies   Patient has no known allergies.   Review of Systems Review of Systems   Physical Exam Triage Vital Signs ED Triage Vitals [05/17/20 2022]  Enc Vitals Group     BP (!) 172/103     Pulse Rate 77     Resp 16     Temp 98.3 F (36.8 C)     Temp src      SpO2 98 %     Weight      Height      Head  Circumference      Peak Flow      Pain Score      Pain Loc      Pain Edu?      Excl. in GC?    No data found.  Updated Vital Signs BP (!) 172/103   Pulse 77   Temp 98.3 F (36.8 C)   Resp 16   LMP 07/08/2016 (Approximate)   SpO2 98%   Provider re-check 159/98 Visual Acuity Right Eye Distance:   Left Eye Distance:   Bilateral Distance:    Right Eye Near:   Left Eye Near:    Bilateral Near:     Physical Exam Vitals and nursing note reviewed.  Constitutional:      General: She is not in acute distress.    Appearance: She is well-developed. She is not ill-appearing.  HENT:     Head: Normocephalic and atraumatic.  Eyes:     Extraocular Movements: Extraocular movements intact.     Conjunctiva/sclera: Conjunctivae normal.     Pupils: Pupils are equal, round, and reactive to light.  Cardiovascular:     Rate and Rhythm: Normal rate and regular rhythm.      Heart sounds: No murmur heard.   Pulmonary:     Effort: Pulmonary effort is normal. No respiratory distress.     Breath sounds: Normal breath sounds.  Abdominal:     Palpations: Abdomen is soft.     Tenderness: There is no abdominal tenderness.  Musculoskeletal:        General: Normal range of motion.     Cervical back: Normal range of motion and neck supple.  Skin:    General: Skin is warm and dry.  Neurological:     Mental Status: She is alert and oriented to person, place, and time.     GCS: GCS eye subscore is 4. GCS verbal subscore is 5. GCS motor subscore is 6.     Cranial Nerves: No cranial nerve deficit, dysarthria or facial asymmetry.     Sensory: No sensory deficit.     Motor: No weakness.     Coordination: Coordination normal.      UC Treatments / Results  Labs (all labs ordered are listed, but only abnormal results are displayed) Labs Reviewed  BASIC METABOLIC PANEL    EKG   Radiology No results found.  Procedures Procedures (including critical care time)  Medications Ordered in UC Medications - No data to display  Initial Impression / Assessment and Plan / UC Course  I have reviewed the triage vital signs and the nursing notes.  Pertinent labs & imaging results that were available during my care of the patient were reviewed by me and considered in my medical decision making (see chart for details).     #Headache #Essential hypertension Patient is a 49 year old presenting with headache and for management of her blood pressure.  Known diagnosis previously on losartan-hydrochlorothiazide 5012.5.  Given has been quite sometime since she has been on this dosage feels that she needs to be started back on hydrochlorothiazide and have her reestablish with a primary care.  Stressed the importance of establishing with a primary care.  Recommend Tylenol for the headache.  Discussed return follow-up precautions.  Patient verbalized understanding. -BMP returned  post discharge, WNL with exception of mild Hypokalemia at 3.2. She was asymptomatic from this standpoint but given initiation of HCTZ, will replete with K supplement. KCL BID x 7 days sent and discussed with patient. Instructed patient  to establish with PCP but if unable to be seen in 7-10 days by that clinic to return to [facility] for potassium recheck. Discussed again ED and return precautions. She verbalized understanding of plan of care. See telephone note.  Final Clinical Impressions(s) / UC Diagnoses   Final diagnoses:  Nonintractable headache, unspecified chronicity pattern, unspecified headache type  Essential hypertension     Discharge Instructions     Start the medicine daily Call the internal medicine center to get a primary doctor and follow up for blood pressure management  Take tylenol for the headache  I sent some basic labs, if these are ok you will not hear back from me, if we need to discuss I will call  If headache gets more severe, you have dizzines, vomiting, blurred vision, chest pain or shortness of breath, go to the emergency department        ED Prescriptions    Medication Sig Dispense Auth. Provider   hydrochlorothiazide (HYDRODIURIL) 25 MG tablet Take 1 tablet (25 mg total) by mouth daily. 30 tablet Mollye Guinta, Veryl Speak, PA-C   acetaminophen (TYLENOL) 325 MG tablet Take 2 tablets (650 mg total) by mouth every 6 (six) hours as needed. 30 tablet Waymon Laser, Veryl Speak, PA-C     PDMP not reviewed this encounter.   Hermelinda Medicus, PA-C 05/18/20 1004

## 2020-05-18 ENCOUNTER — Telehealth (HOSPITAL_COMMUNITY): Payer: Self-pay | Admitting: Physician Assistant

## 2020-05-18 DIAGNOSIS — E876 Hypokalemia: Secondary | ICD-10-CM

## 2020-05-18 MED ORDER — POTASSIUM CHLORIDE ER 10 MEQ PO TBCR: 20.0000 meq | EXTENDED_RELEASE_TABLET | Freq: Two times a day (BID) | ORAL | 0 refills | Status: DC

## 2020-05-18 NOTE — Telephone Encounter (Cosign Needed)
Called patient to discuss mild hypokalemia on BMP obtained prior to initiation of HCTZ. Patient was asymptomatic from a potassium standpoint. Given HCTZ is not potassium sparing will place on KCL BID x 7 days with instructions to follow up with the internal medicine as instructed but if unable to be seen within 7-10 days, return to UC for potassium recheck.   Patient states headache subsided and she has started HCTZ. Discussed that if patient had chest pain, weird heart rate, shortness of breath that she should return to urgent care or report to the ED. She verbalized understanding plan of care and states she will call Monday for PCP appointment.

## 2020-08-05 ENCOUNTER — Encounter: Payer: Self-pay | Admitting: Internal Medicine

## 2020-08-12 ENCOUNTER — Encounter: Payer: Self-pay | Admitting: Internal Medicine

## 2020-08-12 ENCOUNTER — Ambulatory Visit: Payer: Self-pay | Admitting: Internal Medicine

## 2020-08-12 ENCOUNTER — Other Ambulatory Visit: Payer: Self-pay

## 2020-08-12 VITALS — BP 116/82 | HR 101 | Temp 98.7°F | Ht 62.0 in | Wt 119.0 lb

## 2020-08-12 DIAGNOSIS — I10 Essential (primary) hypertension: Secondary | ICD-10-CM

## 2020-08-12 DIAGNOSIS — Z23 Encounter for immunization: Secondary | ICD-10-CM

## 2020-08-12 DIAGNOSIS — Z124 Encounter for screening for malignant neoplasm of cervix: Secondary | ICD-10-CM

## 2020-08-12 DIAGNOSIS — Z13220 Encounter for screening for lipoid disorders: Secondary | ICD-10-CM

## 2020-08-12 MED ORDER — HYDROCHLOROTHIAZIDE 25 MG PO TABS: 25.0000 mg | ORAL_TABLET | Freq: Every day | ORAL | 3 refills | Status: DC

## 2020-08-12 NOTE — Patient Instructions (Addendum)
Thank you for allowing [place] to provide your care today. Today we discussed your blood pressure    I have ordered bmp labs for you. I will call if any are abnormal.    Today we made no changes to your medications.    Please follow-up in 6 months.    Should you have any questions or concerns please call the internal medicine clinic at [phone].     DASH Eating Plan DASH stands for "Dietary Approaches to Stop Hypertension." The DASH eating plan is a healthy eating plan that has been shown to reduce high blood pressure (hypertension). It may also reduce your risk for type 2 diabetes, heart disease, and stroke. The DASH eating plan may also help with weight loss. What are tips for following this plan?  General guidelines  Avoid eating more than 2,300 mg (milligrams) of salt (sodium) a day. If you have hypertension, you may need to reduce your sodium intake to 1,500 mg a day.  Limit alcohol intake to no more than 1 drink a day for nonpregnant women and 2 drinks a day for men. One drink equals 12 oz of beer, 5 oz of wine, or 1 oz of hard liquor.  Work with your health care provider to maintain a healthy body weight or to lose weight. Ask what an ideal weight is for you.  Get at least 30 minutes of exercise that causes your heart to beat faster (aerobic exercise) most days of the week. Activities may include walking, swimming, or biking.  Work with your health care provider or diet and nutrition specialist (dietitian) to adjust your eating plan to your individual calorie needs. Reading food labels   Check food labels for the amount of sodium per serving. Choose foods with less than 5 percent of the Daily Value of sodium. Generally, foods with less than 300 mg of sodium per serving fit into this eating plan.  To find whole grains, look for the word "whole" as the first word in the ingredient list. Shopping  Buy products labeled as "low-sodium" or "no salt added."  Buy fresh foods. Avoid  canned foods and premade or frozen meals. Cooking  Avoid adding salt when cooking. Use salt-free seasonings or herbs instead of table salt or sea salt. Check with your health care provider or pharmacist before using salt substitutes.  Do not fry foods. Cook foods using healthy methods such as baking, boiling, grilling, and broiling instead.  Cook with heart-healthy oils, such as olive, canola, soybean, or sunflower oil. Meal planning  Eat a balanced diet that includes: ? 5 or more servings of fruits and vegetables each day. At each meal, try to fill half of your plate with fruits and vegetables. ? Up to 6-8 servings of whole grains each day. ? Less than 6 oz of lean meat, poultry, or fish each day. A 3-oz serving of meat is about the same size as a deck of cards. One egg equals 1 oz. ? 2 servings of low-fat dairy each day. ? A serving of nuts, seeds, or beans 5 times each week. ? Heart-healthy fats. Healthy fats called Omega-3 fatty acids are found in foods such as flaxseeds and coldwater fish, like sardines, salmon, and mackerel.  Limit how much you eat of the following: ? Canned or prepackaged foods. ? Food that is high in trans fat, such as fried foods. ? Food that is high in saturated fat, such as fatty meat. ? Sweets, desserts, sugary drinks, and other foods with  added sugar. ? Full-fat dairy products.  Do not salt foods before eating.  Try to eat at least 2 vegetarian meals each week.  Eat more home-cooked food and less restaurant, buffet, and fast food.  When eating at a restaurant, ask that your food be prepared with less salt or no salt, if possible. What foods are recommended? The items listed may not be a complete list. Talk with your dietitian about what dietary choices are best for you. Grains Whole-grain or whole-wheat bread. Whole-grain or whole-wheat pasta. Brown rice. [place]. Bulgur. Whole-grain and low-sodium cereals. Pita bread. Low-fat, low-sodium  crackers. Whole-wheat flour tortillas. Vegetables Fresh or frozen vegetables (raw, steamed, roasted, or grilled). Low-sodium or reduced-sodium tomato and vegetable juice. Low-sodium or reduced-sodium tomato sauce and tomato paste. Low-sodium or reduced-sodium canned vegetables. Fruits All fresh, dried, or frozen fruit. Canned fruit in natural juice (without added sugar). Meat and other protein foods Skinless chicken or [place]. Ground chicken or [place]. Pork with fat trimmed off. Fish and seafood. Egg whites. Dried beans, peas, or lentils. Unsalted nuts, nut butters, and seeds. Unsalted canned beans. Lean cuts of beef with fat trimmed off. Low-sodium, lean deli meat. Dairy Low-fat (1%) or fat-free (skim) milk. Fat-free, low-fat, or reduced-fat cheeses. Nonfat, low-sodium ricotta or cottage cheese. Low-fat or nonfat yogurt. Low-fat, low-sodium cheese. Fats and oils Soft margarine without trans fats. Vegetable oil. Low-fat, reduced-fat, or light mayonnaise and salad dressings (reduced-sodium). Canola, safflower, olive, soybean, and sunflower oils. Avocado. Seasoning and other foods Herbs. Spices. Seasoning mixes without salt. Unsalted popcorn and pretzels. Fat-free sweets. What foods are not recommended? The items listed may not be a complete list. Talk with your dietitian about what dietary choices are best for you. Grains Baked goods made with fat, such as croissants, muffins, or some breads. Dry pasta or rice meal packs. Vegetables Creamed or fried vegetables. Vegetables in a cheese sauce. Regular canned vegetables (not low-sodium or reduced-sodium). Regular canned tomato sauce and paste (not low-sodium or reduced-sodium). Regular tomato and vegetable juice (not low-sodium or reduced-sodium). Angie Fava. Olives. Fruits Canned fruit in a light or heavy syrup. Fried fruit. Fruit in cream or butter sauce. Meat and other protein foods Fatty cuts of meat. Ribs. Fried meat. Berniece Salines. Sausage. Bologna and  other processed lunch meats. Salami. Fatback. Hotdogs. Bratwurst. Salted nuts and seeds. Canned beans with added salt. Canned or smoked fish. Whole eggs or egg yolks. Chicken or [place] with skin. Dairy Whole or 2% milk, cream, and half-and-half. Whole or full-fat cream cheese. Whole-fat or sweetened yogurt. Full-fat cheese. Nondairy creamers. Whipped toppings. Processed cheese and cheese spreads. Fats and oils Butter. Stick margarine. Lard. Shortening. Ghee. Bacon fat. Tropical oils, such as coconut, palm kernel, or palm oil. Seasoning and other foods Salted popcorn and pretzels. Onion salt, garlic salt, seasoned salt, table salt, and sea salt. Worcestershire sauce. Tartar sauce. Barbecue sauce. Teriyaki sauce. Soy sauce, including reduced-sodium. Steak sauce. Canned and packaged gravies. Fish sauce. Oyster sauce. Cocktail sauce. Horseradish that you find on the shelf. Ketchup. Mustard. Meat flavorings and tenderizers. Bouillon cubes. Hot sauce and Tabasco sauce. Premade or packaged marinades. Premade or packaged taco seasonings. Relishes. Regular salad dressings. Where to find more information:  National Heart, Lung, and [place]: https://wilson-eaton.com/  American Heart Association: www.heart.org Summary  The DASH eating plan is a healthy eating plan that has been shown to reduce high blood pressure (hypertension). It may also reduce your risk for type 2 diabetes, heart disease, and stroke.  With the DASH  eating plan, you should limit salt (sodium) intake to 2,300 mg a day. If you have hypertension, you may need to reduce your sodium intake to 1,500 mg a day.  When on the DASH eating plan, aim to eat more fresh fruits and vegetables, whole grains, lean proteins, low-fat dairy, and heart-healthy fats.  Work with your health care provider or diet and nutrition specialist (dietitian) to adjust your eating plan to your individual calorie needs. This information is not intended to replace advice  given to you by your health care provider. Make sure you discuss any questions you have with your health care provider. Document Revised: 09/17/2017 Document Reviewed: 09/28/2016 Elsevier Patient Education  2020 [employer].

## 2020-08-12 NOTE — Assessment & Plan Note (Addendum)
Discussed with Ms.Proano regarding defering screening labs until after getting medical coverage or orange card

## 2020-08-12 NOTE — Progress Notes (Signed)
   CC: hypertension  HPI: [name] is a 49 y.o. with PMH listed below presenting with complaint of hypertension. Please see problem based assessment and plan for further details.  Past Medical History:  Diagnosis Date  . Hypertension    Family History No clinically significant family history  Social History Works at [employer]. Lives alone. Has 4 kids (3 boys, 1 girl) who live locally. Occasional alcohol drink every 2-3 weeks. Smokes half pack daily for about 20 years. Had quit for a time but restarted in 2001. Denies illicit substance use.  Review of Systems: Review of Systems  Constitutional: Negative for chills, fever and malaise/fatigue.  Eyes: Negative for blurred vision.  Respiratory: Negative for shortness of breath.   Cardiovascular: Negative for chest pain, palpitations and leg swelling.  Gastrointestinal: Negative for constipation, diarrhea, nausea and vomiting.  Genitourinary: Negative for dysuria, frequency and urgency.  Neurological: Positive for headaches. Negative for dizziness and sensory change.  All other systems reviewed and are negative.    Physical Exam: Vitals:   08/12/20 1435 08/12/20 1505  BP: 119/84 116/82  Pulse: (!) 101   Temp: 98.7 F (37.1 C)   TempSrc: Oral   SpO2: 96%   Weight: 119 lb (54 kg)   Height: 5\' 2"  (1.575 m)    Gen: Well-developed, well nourished, NAD HEENT: NCAT head, hearing intact CV: RRR, S1, S2 normal Pulm: CTAB, No rales, no wheezes Abd: NTND, BS+ Extm: ROM intact, Peripheral pulses intact, No peripheral edema Skin: Dry, Warm, normal turgor, no wounds, no rashes, no lesions  Assessment & Plan:   HTN (hypertension) [name] is a 49 yo F w/ PMH of htn presenting to [facility] to establish care after an urgent care center. [name] states she has previously been diagnosed with hypertension but ran out of her blood pressure medicine as she does not have a PCP and went to an urgent care center last week due to headache.  She was found to have elevated bp at 169/107 and was restarted on her bp meds and told to establish care with [facility]. She treated her headache with ibuprofen as well as restarting her bp meds and is currently symptom free.  A/P BP Readings from Last 3 Encounters:  08/12/20 116/82  05/17/20 (!) 172/103  05/15/20 (!) 148/96   At goal with HCTZ 25mg  daily. Noted to have hypokalemia on recent Bmp at urgent care. Will re-check bmp this visit.  - C/w hctz 25mg  daily - Check bmp  Screening for cholesterol level Discussed with [name] regarding defering screening labs until after getting medical coverage or orange card  Need for immunization against influenza Declined influenza vaccination at this time  Pap smear for cervical cancer screening Declined pap smear during this visit. Discussed need to get routine screening for cervical cancer. [name] expressed understanding.   Patient discussed with Dr. 05/17/20  - , PGY3 [facility] Internal Medicine Pager: [phone]

## 2020-08-12 NOTE — Assessment & Plan Note (Addendum)
Vicki Jordan is a 49 yo F w/ PMH of htn presenting to [facility] to establish care after an urgent care center. Vicki Jordan states she has previously been diagnosed with hypertension but ran out of her blood pressure medicine as she does not have a PCP and went to an urgent care center last week due to headache. She was found to have elevated bp at 169/107 and was restarted on her bp meds and told to establish care with [facility]. She treated her headache with ibuprofen as well as restarting her bp meds and is currently symptom free.  A/P BP Readings from Last 3 Encounters:  08/12/20 116/82  05/17/20 (!) 172/103  05/15/20 (!) 148/96   At goal with HCTZ 25mg  daily. Noted to have hypokalemia on recent Bmp at urgent care. Will re-check bmp this visit.  - C/w hctz 25mg  daily - Check bmp

## 2020-08-12 NOTE — Assessment & Plan Note (Signed)
Declined influenza vaccination at this time

## 2020-08-12 NOTE — Assessment & Plan Note (Signed)
Declined pap smear during this visit. Discussed need to get routine screening for cervical cancer. Vicki Jordan expressed understanding.

## 2020-08-13 LAB — BMP8+ANION GAP
Anion Gap: 16 mmol/L (ref 10.0–18.0)
BUN/Creatinine Ratio: 21 (ref 9–23)
BUN: 14 mg/dL (ref 6–24)
CO2: 25 mmol/L (ref 20–29)
Calcium: 9.6 mg/dL (ref 8.7–10.2)
Chloride: 99 mmol/L (ref 96–106)
Creatinine, Ser: 0.67 mg/dL (ref 0.57–1.00)
GFR calc Af Amer: 119 mL/min/{1.73_m2} (ref >59)
GFR calc non Af Amer: 104 mL/min/{1.73_m2} (ref >59)
Glucose: 83 mg/dL (ref 65–99)
Potassium: 3.2 mmol/L — ABNORMAL LOW (ref 3.5–5.2)
Sodium: 140 mmol/L (ref 134–144)

## 2020-08-16 ENCOUNTER — Telehealth: Payer: Self-pay | Admitting: Internal Medicine

## 2020-08-16 NOTE — Telephone Encounter (Signed)
Attempted to call Ms.Mazzola to discuss lab results and need for potassium supplementation. Ms.Wardell did not pick up. Voicemail box full.

## 2020-08-19 NOTE — Progress Notes (Signed)
Internal Medicine Clinic Attending  Case discussed with Dr. Lee  At the time of the visit.  We reviewed the resident's history and exam and pertinent patient test results.  I agree with the assessment, diagnosis, and plan of care documented in the resident's note.    

## 2020-09-21 ENCOUNTER — Ambulatory Visit (HOSPITAL_COMMUNITY): Admission: EM | Admit: 2020-09-21 | Discharge: 2020-09-21 | Discharge status: Against Medical Advice | Payer: Self-pay

## 2020-09-21 ENCOUNTER — Other Ambulatory Visit: Payer: Self-pay

## 2020-09-21 NOTE — ED Triage Notes (Signed)
Pt left to take significant other to ED.  States she will come back another time.

## 2021-06-03 ENCOUNTER — Telehealth: Payer: Self-pay

## 2021-06-03 NOTE — Telephone Encounter (Signed)
Unable to LVM for pt to call back as soon as possible.   RE: scheduling for mobile mammo event in September.

## 2021-09-12 ENCOUNTER — Other Ambulatory Visit: Payer: Self-pay | Admitting: Internal Medicine

## 2021-09-12 DIAGNOSIS — I10 Essential (primary) hypertension: Secondary | ICD-10-CM

## 2021-10-21 ENCOUNTER — Encounter: Payer: Self-pay | Admitting: Internal Medicine
